# Patient Record
Sex: Female | Born: 1950 | Race: White | Hispanic: No | Smoking: Never smoker
Health system: Southern US, Community
[De-identification: ages and names within clinical notes are randomized; demographics above are authoritative.]

## PROBLEM LIST (undated history)

## (undated) DIAGNOSIS — N814 Uterovaginal prolapse, unspecified: Secondary | ICD-10-CM

## (undated) DIAGNOSIS — IMO0002 Reserved for concepts with insufficient information to code with codable children: Secondary | ICD-10-CM

## (undated) DIAGNOSIS — R32 Unspecified urinary incontinence: Secondary | ICD-10-CM

## (undated) DIAGNOSIS — M1990 Unspecified osteoarthritis, unspecified site: Secondary | ICD-10-CM

## (undated) DIAGNOSIS — Z960 Presence of urogenital implants: Secondary | ICD-10-CM

## (undated) HISTORY — DX: Reserved for concepts with insufficient information to code with codable children: IMO0002

## (undated) HISTORY — DX: Unspecified osteoarthritis, unspecified site: M19.90

## (undated) HISTORY — DX: Presence of urogenital implants: Z96.0

## (undated) HISTORY — DX: Uterovaginal prolapse, unspecified: N81.4

## (undated) HISTORY — DX: Unspecified urinary incontinence: R32

---

## 1998-02-26 ENCOUNTER — Emergency Department (HOSPITAL_COMMUNITY): Admission: EM | Admit: 1998-02-26 | Discharge: 1998-02-26 | Payer: Self-pay | Admitting: Emergency Medicine

## 1999-02-06 ENCOUNTER — Encounter: Admission: RE | Admit: 1999-02-06 | Discharge: 1999-02-06 | Payer: Self-pay | Admitting: Family Medicine

## 1999-02-06 ENCOUNTER — Encounter: Payer: Self-pay | Admitting: Family Medicine

## 1999-12-23 ENCOUNTER — Other Ambulatory Visit: Admission: RE | Admit: 1999-12-23 | Discharge: 1999-12-23 | Payer: Self-pay | Admitting: Family Medicine

## 2000-07-15 ENCOUNTER — Encounter: Admission: RE | Admit: 2000-07-15 | Discharge: 2000-07-15 | Payer: Self-pay | Admitting: Family Medicine

## 2000-07-15 ENCOUNTER — Encounter: Payer: Self-pay | Admitting: Family Medicine

## 2001-10-20 ENCOUNTER — Encounter: Admission: RE | Admit: 2001-10-20 | Discharge: 2001-10-20 | Payer: Self-pay | Admitting: Family Medicine

## 2001-10-20 ENCOUNTER — Encounter: Payer: Self-pay | Admitting: Family Medicine

## 2003-03-01 ENCOUNTER — Encounter: Admission: RE | Admit: 2003-03-01 | Discharge: 2003-03-01 | Payer: Self-pay | Admitting: Family Medicine

## 2003-03-14 ENCOUNTER — Ambulatory Visit (HOSPITAL_COMMUNITY): Admission: RE | Admit: 2003-03-14 | Discharge: 2003-03-14 | Payer: Self-pay | Admitting: Neurosurgery

## 2003-04-05 ENCOUNTER — Ambulatory Visit (HOSPITAL_COMMUNITY): Admission: RE | Admit: 2003-04-05 | Discharge: 2003-04-05 | Payer: Self-pay | Admitting: Family Medicine

## 2003-04-15 HISTORY — PX: BACK SURGERY: SHX140

## 2004-05-16 ENCOUNTER — Ambulatory Visit: Payer: Self-pay | Admitting: Internal Medicine

## 2004-10-30 ENCOUNTER — Ambulatory Visit: Payer: Self-pay | Admitting: Internal Medicine

## 2004-11-26 ENCOUNTER — Ambulatory Visit: Payer: Self-pay | Admitting: Internal Medicine

## 2004-11-27 ENCOUNTER — Ambulatory Visit (HOSPITAL_COMMUNITY): Admission: RE | Admit: 2004-11-27 | Discharge: 2004-11-27 | Payer: Self-pay | Admitting: Family Medicine

## 2005-04-24 ENCOUNTER — Ambulatory Visit: Payer: Self-pay | Admitting: Internal Medicine

## 2005-11-20 ENCOUNTER — Other Ambulatory Visit: Admission: RE | Admit: 2005-11-20 | Discharge: 2005-11-20 | Payer: Self-pay | Admitting: Obstetrics and Gynecology

## 2005-12-01 ENCOUNTER — Ambulatory Visit (HOSPITAL_COMMUNITY): Admission: RE | Admit: 2005-12-01 | Discharge: 2005-12-01 | Payer: Self-pay | Admitting: Family Medicine

## 2005-12-22 ENCOUNTER — Encounter: Admission: RE | Admit: 2005-12-22 | Discharge: 2005-12-22 | Payer: Self-pay | Admitting: Family Medicine

## 2006-12-24 ENCOUNTER — Other Ambulatory Visit: Admission: RE | Admit: 2006-12-24 | Discharge: 2006-12-24 | Payer: Self-pay | Admitting: Obstetrics and Gynecology

## 2007-07-27 ENCOUNTER — Ambulatory Visit (HOSPITAL_COMMUNITY): Admission: RE | Admit: 2007-07-27 | Discharge: 2007-07-27 | Payer: Self-pay | Admitting: Obstetrics and Gynecology

## 2007-08-06 ENCOUNTER — Encounter: Admission: RE | Admit: 2007-08-06 | Discharge: 2007-08-06 | Payer: Self-pay | Admitting: Obstetrics and Gynecology

## 2008-07-26 ENCOUNTER — Encounter: Payer: Self-pay | Admitting: Obstetrics and Gynecology

## 2008-07-26 ENCOUNTER — Ambulatory Visit: Payer: Self-pay | Admitting: Obstetrics and Gynecology

## 2008-07-26 ENCOUNTER — Other Ambulatory Visit: Admission: RE | Admit: 2008-07-26 | Discharge: 2008-07-26 | Payer: Self-pay | Admitting: Obstetrics and Gynecology

## 2008-11-28 ENCOUNTER — Ambulatory Visit: Payer: Self-pay | Admitting: Obstetrics and Gynecology

## 2009-04-14 HISTORY — PX: KNEE ARTHROSCOPY: SHX127

## 2009-08-23 ENCOUNTER — Ambulatory Visit: Payer: Self-pay | Admitting: Obstetrics and Gynecology

## 2009-08-23 ENCOUNTER — Other Ambulatory Visit: Admission: RE | Admit: 2009-08-23 | Discharge: 2009-08-23 | Payer: Self-pay | Admitting: Obstetrics and Gynecology

## 2010-01-14 ENCOUNTER — Telehealth (INDEPENDENT_AMBULATORY_CARE_PROVIDER_SITE_OTHER): Payer: Self-pay | Admitting: *Deleted

## 2010-01-16 ENCOUNTER — Ambulatory Visit: Payer: Self-pay | Admitting: Internal Medicine

## 2010-01-16 DIAGNOSIS — T7840XA Allergy, unspecified, initial encounter: Secondary | ICD-10-CM | POA: Insufficient documentation

## 2010-01-16 DIAGNOSIS — J45998 Other asthma: Secondary | ICD-10-CM | POA: Insufficient documentation

## 2010-01-16 DIAGNOSIS — E7800 Pure hypercholesterolemia, unspecified: Secondary | ICD-10-CM

## 2010-02-26 ENCOUNTER — Ambulatory Visit: Payer: Self-pay | Admitting: Internal Medicine

## 2010-05-04 ENCOUNTER — Encounter: Payer: Self-pay | Admitting: Family Medicine

## 2010-05-05 ENCOUNTER — Encounter: Payer: Self-pay | Admitting: Family Medicine

## 2010-05-14 NOTE — Assessment & Plan Note (Signed)
Summary: 1 months/ mbw   Primary Provider/Referring Provider:  Dr Dianne Dun  CC:  1 month follow up visit-asthma-Increased cough instead of wheezing-used Rescue inahler and cough stopped.Marland Kitchen  History of Present Illness:  History of Present Illness: January 16, 2010- 59 yo never smoker with allergic rhinitis and mild intermittent asthma since childhood, originally followed from 1999 at [facility] where spirometry was normal and she was on allergy vaccine. Last here in 2006, she had done very well and had dropped off her allergy shots when she went to [place]. 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  Triggers for asthma- mold, viral colds, seasonal pollen, especially Fall/ ragweed. Asthma has flared in the last week, with wheeze,  sore throat/ ? viral cold. Needs meds- has none.  Cough is productive thick mucus- hasn't looked at it. No sinus congestion, sneeze, earpressure or headache.   Pneumovax Oct, 1999  February 26, 2010- Asthma, Allergic rhinitis Nurse-CC: 1 month follow up visit-asthma-Increased cough instead of wheezing-used Rescue inahler and cough stopped. Feels much better than last visit. Leaky roof to be repaired soon at school. Now 6 weeks after depo shot she remains stable. Rarely has needed the ventolin rescue inhaler we gave her- maybe six times since last here. Rescue inhaler does help cough- we discussed cough equivalent asthma. Had flu shot.    Asthma History    Asthma Control Assessment:    Age range: 12+ years    Symptoms: 0-2 days/week    Nighttime Awakenings: 0-2/month    Interferes w/ normal activity: no limitations    SABA use (not for EIB): 0-2 days/week    Asthma Control Assessment: Well Controlled   Preventive Screening-Counseling & Management  Alcohol-Tobacco     Smoking Status: never  Current Medications  (verified): 1)  Citalopram Hydrobromide 10 Mg Tabs (Citalopram Hydrobromide) .... Take 1 Tablet By Mouth Once A Day 2)  One-A-Day Extras Antioxidant  Caps (Multiple Vitamins-Minerals) .... Take 1 Capsule By Mouth Once A Day 3)  Fish Oil 1000 Mg Caps (Omega-3 Fatty Acids) .... 2 Caps By Mouth At Bedtime 4)  Coq-10 100 Mg Caps (Coenzyme Q10) .... Take 1 Capsule By Mouth Once A Day 5)  Aspirin 81 Mg Tbec (Aspirin) .... Take 1 Tablet By Mouth Once A Day 6)  Proair Hfa 108 (90 Base) Mcg/act Aers (Albuterol Sulfate) .... 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    Vital Signs:  Patient profile:   60 year old  female Height:      65.5 inches Weight:      178.13 pounds BMI:     29.30 O2 Sat:      95 % on Room air Pulse rate:   75 / minute BP sitting:   116 / 70  (left arm) Cuff size:   regular  Vitals Entered By: Reynaldo Minium CMA (February 26, 2010 3:49 PM)  O2 Flow:  Room  air CC: 1 month follow up visit-asthma-Increased cough instead of wheezing-used Rescue inahler and cough stopped.   Physical Exam  Additional Exam:  General: A/Ox3; pleasant and cooperative, NAD, SKIN: no rash, lesions NODES: no lymphadenopathy HEENT: [place]/AT, EOM- WNL, Conjuctivae- clear, PERRLA, periorbital edema, TM-WNL, Nose- deviated septum-narrow on righ with prominent turbinatest, no polyps, Throat- clear and wnl, Mallampati  III,  NECK: Supple w/ fair ROM, JVD- none, normal carotid impulses w/o bruits Thyroid- normal to palpation CHEST: Clear to P&A with some upper airway pseudowheeze that I pointed out to her.  HEART: RRR, no m/g/r heard ABDOMEN:  KVQ:QVZD, nl pulses, no edema  NEURO: Grossly intact to observation      Impression & Recommendations:  Problem # 1:  ASTHMA (ICD-493.90) She is much happier today. There is considerable stress with her job which feeds into her concerns about the perceived moldy/ old school building.  We will leave her for now with just the rescue inhaler, but we can add maintenance meds as needed Had flu shot  Problem # 2:  ALLERGY (ICD-995.3)  Prominent turbinates but she isn't noting nasal congestion or drainage. We won't add meds.   Other Orders: Est. Patient Level III (63875)  Patient Instructions: 1)  Please schedule a follow-up appointment in 3 months. Call earlier as needed.

## 2010-05-14 NOTE — Progress Notes (Signed)
Summary: req ALC asap  Phone Note Call from Patient   Caller: Patient Call For: young Summary of Call: pt saw dr Zonia Kief at [facility]. (saw dr young at times as well) . wants to resume allergy shots (she's been off for yrs now). works in a [employer] engineer. wants a consult ASAP- can't wait until 1st avail in nov. cough/ wheezing. cell [phone] Initial call taken by: Tivis Ringer, CNA,  January 14, 2010 10:24 AM  Follow-up for Phone Call        Pls advise if we can get this pt worked in asap for [facility] with Dr Maple Hudson, thanks! I requested this chart in case needed Follow-up by: Vernie Murders,  January 14, 2010 10:44 AM  Additional Follow-up for Phone Call Additional follow up Details #1::        Please let pt know we can see them this Wednesday at 2pm. Thanks.Reynaldo Minium CMA  January 14, 2010 1:50 PM   Called, spoke with pt.  She is ok with coming in on Oct. 5 at 2pm to see CY.  Appt scheduled.  Pt informed to arrive 15 minutes early and bring current insurance card and medications to visit.  She verbalized understanding Additional Follow-up by: Gweneth Dimitri RN,  January 14, 2010 2:01 PM

## 2010-05-14 NOTE — Assessment & Plan Note (Signed)
Summary: allergy consult - former [facility] pt / cj   Vital Signs:  Patient profile:   60 year old female Height:      65.5 inches Weight:      178.13 pounds BMI:     29.30 O2 Sat:      98 % on Room air Pulse rate:   65 / minute BP sitting:   124 / 86  (left arm) Cuff size:   regular  Vitals Entered By: Boone Master CNA/MA (January 16, 2010 1:57 PM)  O2 Flow:  Room air CC: asthma flare d/t new work environment with mold, trash and cigarette exposure - former pt of Dr. Andria Meuse Comments Medications reviewed with patient Daytime contact number verified with patient. Boone Master CNA/MA  January 16, 2010 1:59 PM    Primary Cullin Dishman/Referring Yaseen Gilberg:  Dr Dianne Dun  CC:  asthma flare d/t new work environment with mold and trash and cigarette exposure - former pt of Dr. Andria Meuse.  History of Present Illness: January 16, 2010- 59 yo never smoker with allergic rhinitis and mild intermittent asthma since childhood, originally followed from 1999 at [facility] where spirometry was normal and she was on allergy vaccine. Last here in 2006, she had done very well and had dropped off her allergy shots when she went to [place]. 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  Triggers for asthma- mold, viral colds, seasonal pollen, especially Fall/ ragweed. Asthma has flared in the last week, with wheeze,  sore throat/ ? viral cold. Needs meds- has none.  Cough is productive thick mucus- hasn't looked at it. No sinus congestion, sneeze, earpressure or headache.   Pneumovax Oct, 1999  Asthma History    Initial Asthma Severity Rating:    Age range: 12+ years    Symptoms: daily    Nighttime Awakenings: 0-2/month    Interferes w/ normal activity: minor limitations    SABA use (not for EIB): 0-2 days/week    Asthma Severity Assessment: Moderate  Persistent   Preventive Screening-Counseling & Management  Alcohol-Tobacco     Smoking Status: never  Current Medications (verified): 1)  Citalopram Hydrobromide 10 Mg Tabs (Citalopram Hydrobromide) .... Take 1 Tablet By Mouth Once A Day 2)  One-A-Day Extras Antioxidant  Caps (Multiple Vitamins-Minerals) .... Take 1 Capsule By Mouth Once A Day 3)  Fish Oil 1000 Mg Caps (Omega-3 Fatty Acids) .... 2 Caps By Mouth At Bedtime 4)  Coq-10 100 Mg Caps (Coenzyme Q10) .... Take 1 Capsule By Mouth Once A Day 5)  Aspirin 81 Mg Tbec (Aspirin) .... 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  Risk Factors: Smoking Status: never (01/16/2010)  Past Medical History:  ALLERGY (ICD-995.3) HYPERCHOLESTEROLEMIA (ICD-272.0) ASTHMA (ICD-493.90)    Past Surgical History: lumbar spine surgery 2004 right knee surgery Jan 2011  Family History: allergies - mother, sister, father asthma - sister, dad cancer - sister (breast)  Social History: never smoked no alcohol divorced 2 grown children teaches High School band lives aloneSmoking Status:  never  Review of Systems  See HPI       The patient complains of shortness of breath with activity, shortness of breath at rest, productive cough, sneezing, and ear ache.  The patient denies non-productive cough, coughing up blood, chest pain, irregular heartbeats, acid heartburn, indigestion, loss of appetite, weight change, abdominal pain, difficulty swallowing, sore throat, tooth/dental problems, headaches, nasal congestion/difficulty breathing through nose, itching, anxiety, depression, hand/feet swelling, joint stiffness or pain,  rash, change in color of mucus, and fever.    Physical Exam  Additional Exam:  General: A/Ox3; pleasant and cooperative, NAD, SKIN: no rash, lesions NODES: no lymphadenopathy HEENT: [place]/AT, EOM- WNL, Conjuctivae- clear, PERRLA, TM-WNL, Nose- deviated septum-narrow on right, no polyps, Throat- clear and wnl, Mallampati  III,  NECK: Supple w/ fair ROM, JVD- none, normal carotid impulses w/o bruits Thyroid- normal to palpation CHEST: Clear to P&A. Raspy forced expiratory noise- "bronchitic" HEART: RRR, no m/g/r heard ABDOMEN: Soft and nl; nml bowel sounds; no organomegaly or masses noted JYN:WGNF, nl pulses, no edema  NEURO: Grossly intact to observation      Impression & Recommendations:  Problem # 1:  ASTHMA (ICD-493.90) She has a long hx of asthma from childhood, but had done very well over the years after allergy vaccine. The present episode began abruptly a week ago and may be mostly viral. She is concerned about sustained exposure to the moldy, run down school where she now works.   We will try a mixed antiinflammatory strategy frist and see if we can turn this process off. We can reevaluate for long term/ maintenance  approaches if needed. We can allso give her flu vax.   Medications Added to Medication List This Visit: 1)  Citalopram Hydrobromide 10 Mg Tabs (Citalopram hydrobromide) .... Take 1 tablet by mouth once a day 2)  One-a-day Extras Antioxidant Caps (Multiple vitamins-minerals) .... Take 1 capsule by mouth once a day 3)  Fish Oil 1000 Mg Caps (Omega-3 fatty acids) .... 2 caps by mouth at bedtime 4)  Coq-10 100 Mg Caps (Coenzyme q10) .... Take 1 capsule by mouth once a day 5)  Aspirin 81 Mg Tbec (Aspirin) .... Take 1 tablet by mouth once a day 6)  Proair Hfa 108 (90 Base) Mcg/act Aers (Albuterol sulfate) .... 2 puffs four times a day as needed 7)  Singulair 10 Mg Tabs (Montelukast sodium) .Marland Kitchen.. 1 daily  Other Orders: Nebulizer Tx (62130) Admin of Therapeutic Inj   intramuscular or subcutaneous (86578) Depo- Medrol 80mg  (J1040) Admin 1st Vaccine (46962) Flu Vaccine 93yrs + (95284) New Patient Level IV (13244)  Patient Instructions: 1)  Please schedule a follow-up appointment in 1 month. 2)  neb xop 1.25 3)  depo 80 4)  Samples Singulair 10 mg, 1 daily 5)   Script Proair/ albuterol rescue inhaler  sample if available 6)    2 puffs four times a day as needed  7)  Flu vaccine Prescriptions: SINGULAIR 10 MG TABS (MONTELUKAST SODIUM) 1 daily  #7 x 0   Entered and Authorized by:   Waymon Budge MD   Signed by:   Waymon Budge MD on 01/16/2010   Method used:   Samples Given   RxID:   [id number] PROAIR HFA 108 (90 BASE) MCG/ACT AERS (ALBUTEROL SULFATE) 2 puffs four times a day as needed  #1 x prn   Entered and Authorized by:   Waymon Budge MD   Signed by:   Waymon Budge MD on 01/16/2010   Method used:  Print then Give to Patient   RxID:   [id number]    Immunization History:  Influenza Immunization History:    Influenza:  historical (01/12/2009)    Medication Administration  Injection # 1:    Medication: Depo- Medrol 80mg     Diagnosis: ASTHMA (ICD-493.90)    Route: IM    Site: LUOQ gluteus    Exp Date: 07-2012    Lot #: [id number]    Mfr: Pharmacia    Patient tolerated injection without complications    Given by: Boone Master CNA/MA (January 16, 2010 3:24 PM)  Orders Added: 1)  Nebulizer Tx [phone] 2)  Admin of Therapeutic Inj  intramuscular or subcutaneous [96372] 3)  Depo- Medrol 80mg  [J1040] 4)  Admin 1st Vaccine [90471] 5)  Flu Vaccine 82yrs + [24401] 6)  New Patient Level IV [02725]        Flu Vaccine Consent Questions     Do you have a history of severe allergic reactions to this vaccine? no    Any prior history of allergic reactions to egg and/or gelatin? no    Do you have a sensitivity to the preservative Thimersol? no    Do you have a past history of Guillan-Barre Syndrome? no    Do you  currently have an acute febrile illness? no    Have you ever had a severe reaction to latex? no    Vaccine information given and explained to patient? yes    Are you currently pregnant? no    Lot Number:[id number]   Exp Date:10/12/2010   Site Given  Left Deltoid IM.lbflu  Boone Master CNA/MA  January 16, 2010 3:24 PM

## 2010-05-29 ENCOUNTER — Ambulatory Visit (INDEPENDENT_AMBULATORY_CARE_PROVIDER_SITE_OTHER): Payer: BC Managed Care – PPO | Admitting: Internal Medicine

## 2010-05-29 ENCOUNTER — Encounter: Payer: Self-pay | Admitting: Internal Medicine

## 2010-05-29 DIAGNOSIS — J45909 Unspecified asthma, uncomplicated: Secondary | ICD-10-CM

## 2010-05-29 DIAGNOSIS — T7840XA Allergy, unspecified, initial encounter: Secondary | ICD-10-CM

## 2010-05-30 DIAGNOSIS — Z23 Encounter for immunization: Secondary | ICD-10-CM

## 2010-06-05 NOTE — Assessment & Plan Note (Signed)
Summary: rov 3 months/kp   Primary Provider/Referring Provider:  Dr Dianne Dun  CC:  3 month follow up visit-asthma and allergies; tx is making things better at this time.Kathryn Davis  History of Present Illness: History of Present Illness: January 16, 2010- 60 yo never smoker with allergic rhinitis and mild intermittent asthma since childhood, originally followed from 1999 at [facility] where spirometry was normal and she was on allergy vaccine. Last here in 2006, she had done very well and had dropped off her allergy shots when she went to [place]. 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  Triggers for asthma- mold, viral colds, seasonal pollen, especially Fall/ ragweed. Asthma has flared in the last week, with wheeze,  sore throat/ ? viral cold. Needs meds- has none.  Cough is productive thick mucus- hasn't looked at it. No sinus congestion, sneeze, earpressure or headache.   Pneumovax Oct, 1999  February 26, 2010- Asthma, Allergic rhinitis Nurse-CC: 1 month follow up visit-asthma-Increased cough instead of wheezing-used Rescue inahler and cough stopped. Feels much better than last visit. Leaky roof to be repaired soon at school. Now 6 weeks after depo shot she remains stable. Rarely has needed the ventolin rescue inhaler we gave her- maybe six times since last here. Rescue inhaler does help cough- we discussed cough equivalent asthma. Had flu shot.   May 29, 2010- Asthma, Allergic rhinitis Nurse-CC: 3 month follow up visit-asthma and allergies; tx is making things better at this time. Continues working at old school building with dust and mold exposure. They did fix the heat, but not mold or water leaks. Meds help. No colds this winter. We did discuss issue of suppression of symptoms while she continues offending exposure. Hasn't needed Proair since  before Christmas, w/ no wheeze or cough. Sip of water occasionally suppresses cough which would otherwise be a hard paroxysm. Denies postnasal drip or heartburn. Waking with left frontal headaches blamed on indoor heat.     Asthma History    Asthma Control Assessment:    Age range: 12+ years    Symptoms: 0-2 days/week    Nighttime Awakenings: 0-2/month    Interferes w/ normal activity: no limitations    SABA use (not for EIB): 0-2 days/week    Asthma Control Assessment: Well Controlled   Preventive Screening-Counseling & Management  Alcohol-Tobacco     Smoking Status: never  Current Medications (verified): 1)  Citalopram Hydrobromide 10 Mg Tabs (Citalopram Hydrobromide) .... Take 1 Tablet By Mouth Once A Day 2)  One-A-Day Extras Antioxidant  Caps (Multiple Vitamins-Minerals) .... Take 1 Capsule By Mouth Once A Day 3)  Fish Oil 1000 Mg Caps (Omega-3 Fatty Acids) .... 2 Caps By Mouth At Bedtime 4)  Aspirin 81 Mg Tbec (Aspirin) .... Take 1 Tablet By Mouth Once A Day 5)  Proair Hfa 108 (90 Base) Mcg/act Aers (Albuterol Sulfate) .... 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  The patient denies shortness of breath with activity, shortness of breath at rest, productive cough, coughing up blood, chest  pain, irregular heartbeats, acid heartburn, indigestion, loss of appetite, weight change, abdominal pain, difficulty swallowing, sore throat, tooth/dental problems, headaches, nasal congestion/difficulty breathing through nose, and sneezing.    Vital Signs:  Patient profile:   60 year old female Height:      65.5 inches Weight:      184 pounds BMI:     30.26 O2 Sat:      96 % on Room air Pulse rate:   73 / minute BP sitting:   130 / 76  (left arm) Cuff size:   regular  Vitals Entered By: Reynaldo Minium CMA (May 29, 2010 8:59 AM)  O2 Flow:  Room air CC: 3 month follow up visit-asthma and allergies; tx is making things better at this time.   Physical Exam  Additional Exam:  General: A/Ox3; pleasant and cooperative, NAD, SKIN: no rash, lesions NODES: no lymphadenopathy HEENT: [place]/AT, EOM- WNL, Conjuctivae- clear, PERRLA, periorbital edema, TM-WNL, Nose- deviated septum-narrow on righ with prominent turbinates, no polyps, Throat- clear and wnl, Mallampati  III,  NECK: Supple w/ fair ROM, JVD- none, normal carotid impulses w/o bruits Thyroid- normal to palpation CHEST: Trace expiratory wheeze right infrascapular, heard once HEART: RRR, no m/g/r heard ABDOMEN: average build ZOX:WRUE, nl pulses, no edema  NEURO: Grossly intact to observation      Impression & Recommendations:  Problem # 1:  ALLERGY (ICD-995.3)  Discussed anticipatory or maintenance use of an otc antihistamine as needed this Spring and discussed choices.  Discussed saline spray and rinse.  Will give booster pneumovax, first was 10 years ago.  Saline nasa lspray or Neti pot options may help morning headache.   Problem # 2:  ASTHMA (ICD-493.90) Adequate control now. We discussed criteria for addition of a maintenance inhaler if needed.  Came off Singulair- not needed. Came allergy vaccine when she went  [place] se veral years ago and so far ok.  Other Orders: Est. Patient Level III (45409)  Patient  Instructions: 1)  Please schedule a follow-up appointment in 6 months. Please call sooner if needed. 2)  saline nasal spray, or more definitive rinse with a Neti pot can really help dry stuffy noses, early sinus infections etc 3)  Pneumovax    Appended Document: Orders Update-PNA charges    Clinical Lists Changes  Orders: Added new Service order of Pneumococcal Vaccine (81191) - Signed Added new Service order of Admin 1st Vaccine (47829) - Signed Observations: Added new observation of PNEUMOVAXVIS: 03/19/09 version given May 30, 2010. (05/30/2010 9:41) Added new observation of PNEUMOVAXLOT: 1295Z (05/30/2010 9:41) Added new observation of PNEUMOVAXEXP: 08/02/2010 (05/30/2010 9:41) Added new observation of PNEUMOVAXBY: Reynaldo Minium CMA (05/30/2010 9:41) Added new observation of PNEUMOVAXRTE: IM (05/30/2010 9:41) Added new observation of PNEUMOVAXDOS: 0.5 ml (05/30/2010 9:41) Added new observation of PNEUMOVAXMFR: Merck (05/30/2010 9:41) Added new observation of PNEUMOVAXSIT: left deltoid (05/30/2010 9:41) Added new observation of PNEUMOVAX: Pneumovax (05/30/2010 9:41)       Immunizations Administered:  Pneumonia Vaccine:    Vaccine Type: Pneumovax    Site: left deltoid    Mfr: Merck    Dose: 0.5 ml    Route: IM    Given  by: Reynaldo Minium CMA    Exp. Date: 08/02/2010    Lot #: [id number]    VIS given: 03/19/09 version given May 30, 2010.

## 2010-07-25 ENCOUNTER — Telehealth: Payer: Self-pay | Admitting: Internal Medicine

## 2010-07-25 NOTE — Telephone Encounter (Signed)
She has Proair to use up to 2 puffs, 4 x daily for wheezing. If that isn't enough, we may need to give some prednisone. It doesn't sound like a bacterial infection that an antibiotic would help. She can try Mucinex and extra fluids. Let us know tomorrow early if she still isn't doing well.

## 2010-07-25 NOTE — Telephone Encounter (Signed)
Spoke w/ pt and she c/o dry cough, wheezing, some nasal congestion, when sneezes only has clear mucus come out, some increased sob. Pt states this all started over the weekend. Pt is using her xopenex inhaler every 4 hrs. Pt has an apt 11/21/10. Please advise recs for pt Dr. Maple Hudson. Thanks  NKDA  Carver Fila, CMA

## 2010-07-26 NOTE — Telephone Encounter (Signed)
LMTCB

## 2010-07-29 ENCOUNTER — Telehealth: Payer: Self-pay | Admitting: Internal Medicine

## 2010-07-29 NOTE — Telephone Encounter (Signed)
lmomtcb x 2  

## 2010-07-29 NOTE — Telephone Encounter (Signed)
lmomtcb x 1. See previous msg from 07/25/2010.

## 2010-07-31 NOTE — Telephone Encounter (Signed)
We have tried to reach the pt several times and have left msgs for her to return triage's call. Left a detailed msg on VM asking that she call our office to leave a msg for the nurse if she is still having problems. Will sign off on this msg.

## 2010-08-30 NOTE — Op Note (Signed)
NAME:  Kathryn Davis, Kathryn Davis                  ACCOUNT NO.:  [id number]   MEDICAL RECORD NO.:  [id number]                   PATIENT TYPE:  OIB   LOCATION:  2855                                 FACILITY:  [facility]   PHYSICIAN:  Reinaldo Meeker, M.D.              DATE OF BIRTH:  Jul 22, 1950   DATE OF PROCEDURE:  03/14/2003  DATE OF DISCHARGE:                                 OPERATIVE REPORT   PREOPERATIVE DIAGNOSIS:  Herniated disk, L5-S1, right.   POSTOPERATIVE DIAGNOSIS:  Herniated disk, L5-S1, right.   OPERATION PERFORMED:  Right L5-S1 interlaminar laminotomy for excision of  herniated disk with operating microscope.   SECONDARY PROCEDURE:  Microdissection of L5-S1 disk and S1 nerve root.   SURGEON:  Reinaldo Meeker, M.D.   ASSISTANT:  Kathaleen Maser. Pool, M.D.   ANESTHESIA:  General.   DESCRIPTION OF PROCEDURE:  After being placed in the prone position, the  patient's back was prepped and draped in the usual sterile fashion.  Localizing x-ray was taken prior to incision to identify the appropriate  level.  Midline incision was made above the spinous processes of L5 and S1.  Using Bovie cutting current, the incision was carried to the spinous  processes.  Subperiosteal dissection was then carried out on the right side  of the spinous processes and lamina and the McCullough self-retaining  retractor was placed for exposure.  Second x-ray showed approach to the  appropriate  level.  Using the high speed drill, the inferior one third of  the L5 lamina and medial one third of the facet joint were removed.  The  drill was then used to remove the superior one third of the S1 lamina.  Residual bone and ligamentum flavum were removed in a piecemeal fashion.  The microscope was draped, brought into the field and used for the remainder  of the case.  Using microdissection technique, the lateral aspect of the  thecal sac and S1 nerve were identified.  Cutting coagulation was carried  down to  the floor of the canal to identify the L5-S1 disk which was found to  be focally herniated beneath the nerve root.  After coagulating out the  annulus, the annulus was incised with a 15 blade.  Using pituitary rongeurs  and curets, a thorough disk space cleanout was carried out.  At the same  time great care was taken to avoid injury to the neural element which was  successfully done.  At this point inspection was carried out in all  directions for any evidence of residual compression and none could be  identified.  Large amounts of irrigation were carried out and any bleeding  controlled with bipolar coagulation and Gelfoam.  The wound was then closed  using interrupted Vicryl in the muscle and fascia, subcutaneous and  subcuticular tissues and staples on the skin.  A sterile dressing was then  applied and the patient was extubated and taken to the recovery room  in  stable condition.                                               Reinaldo Meeker, M.D.    ROK/MEDQ  D:  03/14/2003  T:  03/14/2003  Job:  [id number]

## 2010-09-24 ENCOUNTER — Other Ambulatory Visit (HOSPITAL_COMMUNITY)
Admission: RE | Admit: 2010-09-24 | Discharge: 2010-09-24 | Disposition: A | Discharge status: Home / Self Care | Payer: BC Managed Care – PPO | Source: Ambulatory Visit | Attending: Obstetrics and Gynecology | Admitting: Obstetrics and Gynecology

## 2010-09-24 ENCOUNTER — Encounter (INDEPENDENT_AMBULATORY_CARE_PROVIDER_SITE_OTHER): Payer: BC Managed Care – PPO | Admitting: Obstetrics and Gynecology

## 2010-09-24 ENCOUNTER — Other Ambulatory Visit: Payer: Self-pay | Admitting: Obstetrics and Gynecology

## 2010-09-24 DIAGNOSIS — Z124 Encounter for screening for malignant neoplasm of cervix: Secondary | ICD-10-CM | POA: Insufficient documentation

## 2010-09-24 DIAGNOSIS — R82998 Other abnormal findings in urine: Secondary | ICD-10-CM

## 2010-09-24 DIAGNOSIS — Z01419 Encounter for gynecological examination (general) (routine) without abnormal findings: Secondary | ICD-10-CM

## 2010-09-24 DIAGNOSIS — Z833 Family history of diabetes mellitus: Secondary | ICD-10-CM

## 2010-10-22 ENCOUNTER — Other Ambulatory Visit: Payer: Self-pay | Admitting: Obstetrics and Gynecology

## 2010-10-22 DIAGNOSIS — Z1231 Encounter for screening mammogram for malignant neoplasm of breast: Secondary | ICD-10-CM

## 2010-10-29 ENCOUNTER — Ambulatory Visit
Admission: RE | Admit: 2010-10-29 | Discharge: 2010-10-29 | Disposition: A | Discharge status: Home / Self Care | Payer: BC Managed Care – PPO | Source: Ambulatory Visit | Attending: Obstetrics and Gynecology | Admitting: Obstetrics and Gynecology

## 2010-10-29 DIAGNOSIS — Z1231 Encounter for screening mammogram for malignant neoplasm of breast: Secondary | ICD-10-CM

## 2010-10-30 ENCOUNTER — Other Ambulatory Visit: Payer: Self-pay | Admitting: Obstetrics and Gynecology

## 2010-10-30 DIAGNOSIS — R928 Other abnormal and inconclusive findings on diagnostic imaging of breast: Secondary | ICD-10-CM

## 2010-11-05 ENCOUNTER — Ambulatory Visit
Admission: RE | Admit: 2010-11-05 | Discharge: 2010-11-05 | Disposition: A | Discharge status: Home / Self Care | Payer: BC Managed Care – PPO | Source: Ambulatory Visit | Attending: Obstetrics and Gynecology | Admitting: Obstetrics and Gynecology

## 2010-11-05 DIAGNOSIS — R928 Other abnormal and inconclusive findings on diagnostic imaging of breast: Secondary | ICD-10-CM

## 2010-11-08 ENCOUNTER — Encounter: Payer: Self-pay | Admitting: Internal Medicine

## 2010-11-13 ENCOUNTER — Encounter: Payer: Self-pay | Admitting: Internal Medicine

## 2010-11-13 ENCOUNTER — Ambulatory Visit (INDEPENDENT_AMBULATORY_CARE_PROVIDER_SITE_OTHER): Payer: BC Managed Care – PPO | Admitting: Internal Medicine

## 2010-11-13 VITALS — BP 118/70 | HR 72 | Ht 65.5 in | Wt 193.0 lb

## 2010-11-13 DIAGNOSIS — J45909 Unspecified asthma, uncomplicated: Secondary | ICD-10-CM

## 2010-11-13 MED ORDER — ALBUTEROL SULFATE HFA 108 (90 BASE) MCG/ACT IN AERS: 2.0000 | INHALATION_SPRAY | RESPIRATORY_TRACT | Status: DC | PRN

## 2010-11-13 NOTE — Progress Notes (Signed)
Subjective:    Patient ID: Kathryn Davis, female    DOB: [birth year], 60 y.o.   MRN: [id number]  HPI 11/13/10- 71 yoF never smoker followed for asthma, allergic rhinitis Last here May 29, 2010 School building getting a new roof, to fix water leaks. 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 She is optimistic that repair of water leaks will improve the mold and environmental hazard concerns that she has had about the building. Needed rescue inhaler for awhile this spring x 2 weeks with wheeze then. Needs refill. Discussed role of rescue meds.  Review of Systems Constitutional:   No-   weight loss, night sweats, fevers, chills, fatigue, lassitude. HEENT:   No-   headaches, difficulty swallowing, tooth/dental problems, sore throat,                  No-   sneezing, itching, ear ache, nasal congestion, post nasal drip,   CV:  No-   chest pain, orthopnea, PND, swelling in lower extremities, anasarca, dizziness, palpitations  GI:  No-   heartburn, indigestion, abdominal pain, nausea, vomiting, diarrhea,                 change in bowel habits, loss of appetite  Resp: No-   shortness of breath with exertion or at rest.  No-  excess mucus,             No-   productive cough,  No non-productive cough,  No-  coughing up of blood.              No-   change in color of mucus.  No- wheezing.    Skin: No-   rash or lesions.  GU: No-   dysuria, change in color of urine, no urgency or frequency.  No- flank pain.  MS:  No-   joint pain or swelling.  No- decreased range of motion.  No- back pain.  Psych:  No- change in mood or affect. No depression or anxiety.  No memory loss.      Objective:   Physical Exam General- Alert, Oriented, Affect-appropriate, Distress- none acute Skin- rash-none, lesions- none, excoriation- none Lymphadenopathy- none Head- atraumatic            Eyes- Gross vision intact, PERRLA, conjunctivae clear secretions            Ears- Hearing, canals  normal            Nose- Clear, No-Septal dev, mucus, polyps, erosion, perforation             Throat- Mallampati III , mucosa clear , drainage- none, tonsils- atrophic Neck- flexible , trachea midline, no stridor , thyroid nl, carotid no bruit Chest - symmetrical excursion , unlabored           Heart/CV- RRR , no murmur , no gallop  , no rub, nl s1 s2                           - JVD- none , edema- none, stasis changes- none, varices- none           Lung- clear to P&A, wheeze- none, cough- none , dullness-none, rub- none           Chest wall-  Abd- tender-no, distended-no, bowel sounds-present, HSM- no Br/ Gen/ Rectal- Not done, not indicated Extrem- cyanosis- none, clubbing, none, atrophy- none, strength- nl Neuro- grossly intact to observation  Assessment & Plan:

## 2010-11-13 NOTE — Patient Instructions (Signed)
Script for rescue inhaler  Please call as needed

## 2010-11-13 NOTE — Assessment & Plan Note (Addendum)
Good control. 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 Also discussed use of rescue inhaler this spring. I don't think she needs maintenance med at this time.

## 2010-11-21 ENCOUNTER — Ambulatory Visit: Payer: BC Managed Care – PPO | Admitting: Internal Medicine

## 2011-02-25 ENCOUNTER — Telehealth: Payer: Self-pay | Admitting: Internal Medicine

## 2011-02-25 NOTE — Telephone Encounter (Signed)
[email or web address]

## 2011-02-26 NOTE — Telephone Encounter (Signed)
LMOMTCB x 1 

## 2011-02-27 NOTE — Telephone Encounter (Signed)
lmomtcb  

## 2011-02-28 NOTE — Telephone Encounter (Signed)
After several attempts to reach patient and unable I will sign the note;I did call and left a message for patient to call us if she still needed CY to treat her.

## 2011-09-25 ENCOUNTER — Encounter: Payer: Self-pay | Admitting: Obstetrics and Gynecology

## 2011-09-25 ENCOUNTER — Ambulatory Visit (INDEPENDENT_AMBULATORY_CARE_PROVIDER_SITE_OTHER): Payer: BC Managed Care – PPO | Admitting: Obstetrics and Gynecology

## 2011-09-25 VITALS — BP 120/74 | Ht 64.5 in | Wt 196.0 lb

## 2011-09-25 DIAGNOSIS — N814 Uterovaginal prolapse, unspecified: Secondary | ICD-10-CM

## 2011-09-25 DIAGNOSIS — Z960 Presence of urogenital implants: Secondary | ICD-10-CM | POA: Insufficient documentation

## 2011-09-25 DIAGNOSIS — Z01419 Encounter for gynecological examination (general) (routine) without abnormal findings: Secondary | ICD-10-CM

## 2011-09-25 DIAGNOSIS — Z833 Family history of diabetes mellitus: Secondary | ICD-10-CM

## 2011-09-25 DIAGNOSIS — N8111 Cystocele, midline: Secondary | ICD-10-CM

## 2011-09-25 DIAGNOSIS — E7800 Pure hypercholesterolemia, unspecified: Secondary | ICD-10-CM

## 2011-09-25 DIAGNOSIS — IMO0002 Reserved for concepts with insufficient information to code with codable children: Secondary | ICD-10-CM | POA: Insufficient documentation

## 2011-09-25 MED ORDER — CITALOPRAM HYDROBROMIDE 10 MG PO TABS: 10.0000 mg | ORAL_TABLET | Freq: Every day | ORAL | Status: DC

## 2011-09-25 NOTE — Progress Notes (Signed)
Patient came to see me today for her annual GYN exam. She continues to use a ring pessary with support size 5 which gives her excellent results for her uterine prolapse and cystocele. She is not sexually active. She is not having vaginal bleeding. She is not having pelvic pain. She is up-to-date on mammograms. She is due for a followup bone density. She had a normal bone density in  2003. She has had no fractures. She has never had an abnormal Pap smear. In 2011 her total cholesterol was 258 was triglycerides of 278 and LDL of 155. Her HDL was 47. She needs this rechecked but did not fast  today. Her mother has just been diagnosed with diabetes. She takes Celexa with excellent results for mood swings.  Physical examination: Selena Batten gardner present. HEENT within normal limits. Neck: Thyroid not large. No masses. Supraclavicular nodes: not enlarged. Breasts: Examined in both sitting and lying  position. No skin changes and no masses. Abdomen: Soft no guarding rebound or masses or hernia. Pelvic: External: Within normal limits. BUS: Within normal limits. Vaginal:within normal limits. Good estrogen effect. Almost third degree cystocele. Cervix: clean. Uterus: Normal size and shape with first degree uterine prolapse. Adnexa: No masses. Rectovaginal exam: Confirmatory and negative. Extremities: Within normal limits.  Assessment: Uterine prolapse with cystocele. Elevated lipids.  Plan: Scheduled bone density. Pessary removed, washed, and reinserted. She will return when necessary. Mammogram this Summer. Return fasted for lab work.

## 2011-09-25 NOTE — Patient Instructions (Addendum)
Return fasted for lab work. 

## 2011-09-26 LAB — URINALYSIS W MICROSCOPIC + REFLEX CULTURE
Casts: NONE SEEN
Glucose, UA: NEGATIVE mg/dL
Ketones, ur: NEGATIVE mg/dL
pH: 6 (ref 5.0–8.0)

## 2011-09-27 LAB — URINE CULTURE: Colony Count: 9000

## 2011-10-10 ENCOUNTER — Other Ambulatory Visit: Payer: Self-pay | Admitting: Obstetrics and Gynecology

## 2011-10-21 ENCOUNTER — Other Ambulatory Visit: Payer: Self-pay | Admitting: Obstetrics and Gynecology

## 2011-10-21 DIAGNOSIS — Z1231 Encounter for screening mammogram for malignant neoplasm of breast: Secondary | ICD-10-CM

## 2011-10-31 ENCOUNTER — Ambulatory Visit
Admission: RE | Admit: 2011-10-31 | Discharge: 2011-10-31 | Disposition: A | Discharge status: Home / Self Care | Payer: BC Managed Care – PPO | Source: Ambulatory Visit | Attending: Obstetrics and Gynecology | Admitting: Obstetrics and Gynecology

## 2011-10-31 DIAGNOSIS — Z1231 Encounter for screening mammogram for malignant neoplasm of breast: Secondary | ICD-10-CM

## 2011-11-07 ENCOUNTER — Ambulatory Visit: Payer: BC Managed Care – PPO

## 2011-11-13 ENCOUNTER — Ambulatory Visit (INDEPENDENT_AMBULATORY_CARE_PROVIDER_SITE_OTHER): Payer: BC Managed Care – PPO | Admitting: Internal Medicine

## 2011-11-13 ENCOUNTER — Encounter: Payer: Self-pay | Admitting: Internal Medicine

## 2011-11-13 VITALS — BP 130/80 | HR 73 | Ht 65.5 in | Wt 194.0 lb

## 2011-11-13 DIAGNOSIS — J3089 Other allergic rhinitis: Secondary | ICD-10-CM

## 2011-11-13 DIAGNOSIS — J309 Allergic rhinitis, unspecified: Secondary | ICD-10-CM

## 2011-11-13 DIAGNOSIS — J45998 Other asthma: Secondary | ICD-10-CM

## 2011-11-13 DIAGNOSIS — J45909 Unspecified asthma, uncomplicated: Secondary | ICD-10-CM

## 2011-11-13 MED ORDER — ALBUTEROL SULFATE HFA 108 (90 BASE) MCG/ACT IN AERS: 2.0000 | INHALATION_SPRAY | RESPIRATORY_TRACT | Status: DC | PRN

## 2011-11-13 NOTE — Progress Notes (Signed)
Subjective:    Patient ID: Kathryn Davis, female    DOB: [birth year], 61 y.o.   MRN: [id number]  HPI 11/13/10- 27 yoF never smoker followed for asthma, allergic rhinitis Last here May 29, 2010 School building getting a new roof, to fix water leaks. 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 She is optimistic that repair of water leaks will improve the mold and environmental hazard concerns that she has had about the building. Needed rescue inhaler for awhile this spring x 2 weeks with wheeze then. Needs refill. Discussed role of rescue meds.  11/13/11- 60 yoF never smoker followed for asthma, allergic rhinitis Denies any wheezing, cough, congestion, or SOB at this time; denies any trouble with allergies . 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 There is no more mold problem. She needed her metered inhaler only with the spring cold but has not needed maintenance therapy. She credits her years of allergy vaccine in the past for resolving her allergic rhinitis and had no problem this spring.  ROS-see HPI Constitutional:   No-   weight loss, night sweats, fevers, chills, fatigue, lassitude. HEENT:   No-  headaches, difficulty swallowing, tooth/dental problems, sore throat,       No-  sneezing, itching, ear ache, nasal congestion, post nasal drip,  CV:  No-   chest pain, orthopnea, PND, swelling in lower extremities, anasarca, dizziness, palpitations Resp: No-   shortness of breath with exertion or at rest.              No-   productive cough,  No non-productive cough,  No- coughing up of blood.              No-   change in color of mucus.  No- wheezing.   Skin: No-   rash or lesions. GI:  No-   heartburn, indigestion, abdominal pain, nausea, vomiting,  GU:  MS:  No-   joint pain or swelling.  . Neuro-     nothing unusual Psych:  No- change in mood or affect. No depression or anxiety.  No memory  loss.  Objective:   Physical Exam General- Alert, Oriented, Affect-appropriate, Distress- none acute Skin- rash-none, lesions- none, excoriation- none Lymphadenopathy- none Head- atraumatic            Eyes- Gross vision intact, PERRLA, conjunctivae clear secretions            Ears- Hearing, canals normal            Nose- + turbinate edema, No-Septal dev, mucus, polyps, erosion, perforation             Throat- Mallampati III , mucosa clear , drainage- none, tonsils- atrophic Neck- flexible , trachea midline, no stridor , thyroid nl, carotid no bruit Chest - symmetrical excursion , unlabored           Heart/CV- RRR , no murmur , no gallop  , no rub, nl s1 s2                           - JVD- none , edema- none, stasis changes- none, varices- none           Lung- clear to P&A, wheeze- none, cough- none , dullness-none, rub- none           Chest wall-  Abd-  Br/ Gen/ Rectal- Not done, not indicated Extrem- cyanosis-  none, clubbing, none, atrophy- none, strength- nl Neuro- grossly intact to observation  Assessment & Plan:

## 2011-11-13 NOTE — Patient Instructions (Addendum)
Script sent for refill proair rescue inhaler  Ok to use an otc antihistamine if needed  Please call as needed

## 2011-11-19 DIAGNOSIS — J302 Other seasonal allergic rhinitis: Secondary | ICD-10-CM | POA: Insufficient documentation

## 2011-11-19 NOTE — Assessment & Plan Note (Signed)
Well controlled with only occasional need for rescue inhaler. We discussed this and will refill.

## 2011-11-19 NOTE — Assessment & Plan Note (Signed)
I'm surprised after examining her nose, but she doesn't admit more nasal congestion and nose blowing but she insists she is comfortable. We discussed antihistamines.

## 2012-05-18 ENCOUNTER — Other Ambulatory Visit: Payer: Self-pay | Admitting: Gynecology

## 2012-05-18 DIAGNOSIS — Z1231 Encounter for screening mammogram for malignant neoplasm of breast: Secondary | ICD-10-CM

## 2012-05-21 ENCOUNTER — Encounter (HOSPITAL_COMMUNITY): Payer: Self-pay | Admitting: Emergency Medicine

## 2012-05-21 ENCOUNTER — Emergency Department (HOSPITAL_COMMUNITY)
Admission: EM | Admit: 2012-05-21 | Discharge: 2012-05-21 | Disposition: A | Discharge status: Home / Self Care | Payer: BC Managed Care – PPO | Attending: Emergency Medicine | Admitting: Emergency Medicine

## 2012-05-21 DIAGNOSIS — R42 Dizziness and giddiness: Secondary | ICD-10-CM | POA: Insufficient documentation

## 2012-05-21 DIAGNOSIS — J45909 Unspecified asthma, uncomplicated: Secondary | ICD-10-CM | POA: Insufficient documentation

## 2012-05-21 DIAGNOSIS — Z79899 Other long term (current) drug therapy: Secondary | ICD-10-CM | POA: Insufficient documentation

## 2012-05-21 DIAGNOSIS — Z8742 Personal history of other diseases of the female genital tract: Secondary | ICD-10-CM | POA: Insufficient documentation

## 2012-05-21 DIAGNOSIS — R110 Nausea: Secondary | ICD-10-CM | POA: Insufficient documentation

## 2012-05-21 DIAGNOSIS — L0291 Cutaneous abscess, unspecified: Secondary | ICD-10-CM

## 2012-05-21 DIAGNOSIS — IMO0002 Reserved for concepts with insufficient information to code with codable children: Secondary | ICD-10-CM | POA: Insufficient documentation

## 2012-05-21 MED ORDER — ONDANSETRON HCL 4 MG/2ML IJ SOLN: 4.0000 mg | Freq: Once | INTRAMUSCULAR | Status: DC

## 2012-05-21 MED ORDER — SULFAMETHOXAZOLE-TRIMETHOPRIM 800-160 MG PO TABS: 1.0000 | ORAL_TABLET | Freq: Two times a day (BID) | ORAL | Status: DC

## 2012-05-21 MED ORDER — ACETAMINOPHEN-CODEINE #3 300-30 MG PO TABS: 1.0000 | ORAL_TABLET | Freq: Four times a day (QID) | ORAL | Status: DC | PRN

## 2012-05-21 MED ORDER — SODIUM CHLORIDE 0.9 % IV BOLUS (SEPSIS)
1000.0000 mL | Freq: Once | INTRAVENOUS | Status: AC
  Administered 2012-05-21: 1000 mL via INTRAVENOUS

## 2012-05-21 NOTE — ED Notes (Signed)
Pt states she inadvertently shaved off mole under R arm, area red, swollen with drainage.

## 2012-05-21 NOTE — ED Provider Notes (Signed)
History   This chart was scribed for non-physician practitioner working with Celene Kras, MD by Leone Payor, ED Scribe. This patient was seen in room WTR8/WTR8 and the patient's care was started at 2020.   CSN: [id number]  Arrival date & time 05/21/12  2020   First MD Initiated Contact with Patient 05/21/12 2135      Chief Complaint  Patient presents with  . Abscess     The history is provided by the patient. No language interpreter was used.    Kathryn Davis is a 62 y.o. female who presents to the Emergency Department complaining of a new, constant abscess starting 5 days ago after "shaving off a mole" under right arm. Pt rates the pain as 4/10 currently. States the area has gradually improved after soaking the area in warm water with epsom salts. She has taking OTC pain medication with mild relief. She denies fever, chills.   Pt has h/o asthma, cystocele, back surgery.  Pt denies smoking and alcohol use.  Past Medical History  Diagnosis Date  . Uterine prolapse   . Cystocele   . Asthma   . Vaginal pessary present     Past Surgical History  Procedure Date  . Back surgery 2005    HERNIATED DISC  . Knee arthroscopy 2011    Family History  Problem Relation Age of Onset  . Diabetes Mother   . Osteoporosis Mother   . Breast cancer Sister     Age 40  . Breast cancer Maternal Aunt     Age 69    History  Substance Use Topics  . Smoking status: Never Smoker   . Smokeless tobacco: Not on file  . Alcohol Use: No    OB History    Grav Para Term Preterm Abortions TAB SAB Ect Mult Living   2 2 2       2       Review of Systems A complete 10 system review of systems was obtained and all systems are negative except as noted in the HPI and PMH.    Allergies  Review of patient's allergies indicates no known allergies.  Home Medications   Current Outpatient Rx  Name  Route  Sig  Dispense  Refill  . ALBUTEROL SULFATE HFA 108 (90 BASE) MCG/ACT IN AERS    Inhalation   Inhale 2 puffs into the lungs every 4 (four) hours as needed. Asthma         . ASPIRIN 81 MG PO TABS   Oral   Take 81 mg by mouth.           Marland Kitchen CITALOPRAM HYDROBROMIDE 10 MG PO TABS   Oral   Take 10 mg by mouth daily.         Marland Kitchen GLUCOSAMINE CHONDR COMPLEX PO   Oral   Take 1 tablet by mouth daily.          . MULTIVITAMIN PO   Oral   Take 1 tablet by mouth daily.          Marland Kitchen OVER THE COUNTER MEDICATION   Oral   Take 1 tablet by mouth daily. Mega Red-take 1 by mouth aware         . PSYLLIUM 95 % PO PACK   Oral   Take 1 packet by mouth 2 (two) times daily.           BP 140/69  Pulse 81  Temp 98.1 F (36.7 C) (Oral)  Resp 16  Ht 5\' 5"  (1.651 m)  Wt 200 lb (90.719 kg)  BMI 33.28 kg/m2  SpO2 96%  Physical Exam  Nursing note and vitals reviewed. Constitutional: She is oriented to person, place, and time. She appears well-developed and well-nourished. No distress.  HENT:  Head: Normocephalic and atraumatic.  Eyes: Conjunctivae normal and EOM are normal. Pupils are equal, round, and reactive to light.  Neck: Neck supple. No tracheal deviation present.  Cardiovascular: Normal rate, regular rhythm, normal heart sounds and intact distal pulses.  Exam reveals no gallop and no friction rub.   No murmur heard. Pulmonary/Chest: Effort normal and breath sounds normal. No respiratory distress. She has no wheezes. She has no rales. She exhibits no tenderness.  Musculoskeletal: Normal range of motion.  Neurological: She is alert and oriented to person, place, and time.  Skin: Skin is warm and dry.       1.5cm diameter indurated abscess under R axillary region with surrounding edema and erythema evident of cellulitis. Warm to touch. No drainage.    Psychiatric: She has a normal mood and affect. Her behavior is normal.    ED Course  Procedures (including critical care time) INCISION AND DRAINAGE Performed by: Johnnette Gourd Consent: Verbal consent  obtained. Risks and benefits: risks, benefits and alternatives were discussed Type: abscess  Body area: Below R axillary region  Anesthesia: local infiltration  Incision was made with a scalpel.  Local anesthetic: lidocaine 2% with epinephrine  Anesthetic total: 5 ml  Complexity: complex Blunt dissection to break up loculations  Drainage: purulent  Drainage amount: small  Packing material: none  Patient tolerance: Patient tolerated the procedure well with no immediate complications.    DIAGNOSTIC STUDIES: Oxygen Saturation is 96% on room air, adequate by my interpretation.    COORDINATION OF CARE:  9:45 PM Discussed treatment plan which includes I&D with pt at bedside and pt agreed to plan.    Labs Reviewed - No data to display No results found.   1. Abscess and cellulitis   2. Nausea       MDM  62 year old female with an abscess and surrounding cellulitis. After I&D patient became lightheaded and nauseated. She appeared very pale and was dizzy upon standing. Orthostatic vital signs normal. Fluid bolus given. After fluid bolus patient appeared in no apparent distress and states she felt much better. She was able to get up and ambulating without difficulty. She will be discharged with Tylenol No. 3 and Bactrim. She'll followup with her PCP on Monday. Advised warm compresses. Return precautions discussed. Patient states her understanding of plan and is agreeable.    I personally performed the services described in this documentation, which was scribed in my presence. The recorded information has been reviewed and is accurate.   Trevor Mace, PA-C 05/21/12 2348

## 2012-05-21 NOTE — ED Notes (Signed)
Pt got pale, nauseous and diaphoretic during I&D procedure. Further orders received.

## 2012-05-21 NOTE — ED Notes (Signed)
Pt ambulatory to exam room with steady gait.  

## 2012-05-21 NOTE — ED Notes (Signed)
Pt has red swollen area to R axilla. Pt has area covered with gauze and tape. Pt states there is hard area under the swelling that has been there for a while.

## 2012-05-23 NOTE — ED Provider Notes (Signed)
Medical screening examination/treatment/procedure(s) were performed by non-physician practitioner and as supervising physician I was immediately available for consultation/collaboration.   Celene Kras, MD 05/23/12 [phone]

## 2012-05-24 ENCOUNTER — Ambulatory Visit (INDEPENDENT_AMBULATORY_CARE_PROVIDER_SITE_OTHER): Payer: BC Managed Care – PPO | Admitting: Gynecology

## 2012-05-24 ENCOUNTER — Encounter: Payer: Self-pay | Admitting: Gynecology

## 2012-05-24 VITALS — BP 120/78

## 2012-05-24 DIAGNOSIS — IMO0002 Reserved for concepts with insufficient information to code with codable children: Secondary | ICD-10-CM

## 2012-05-24 DIAGNOSIS — L02411 Cutaneous abscess of right axilla: Secondary | ICD-10-CM | POA: Insufficient documentation

## 2012-05-24 MED ORDER — SULFAMETHOXAZOLE-TRIMETHOPRIM 800-160 MG PO TABS: ORAL_TABLET | ORAL | Status: DC

## 2012-05-24 NOTE — Progress Notes (Signed)
Patient is a 62 year old who presented to the office for followup as a result of an attempted I&D on axillary (right) access in the emergency room on 04/20/2012. Patient was released on the ED and prescribed Bactrim 1 tablet by mouth twice a day for 7 weeks. Patient prior to going to the emergency room had irritated the area while shaving.  Exam: Right axillary inflamed what appears to be a sebaceous cyst that has started to drain purulent material for which a culture from her so was obtained.  The area was cleansed with Betadine solution 1% lidocaine was infiltrated subdermally near the previous I&D site for which the edges were separated and a defect was noted. Fibrinous material was removed and the area was copious irrigated with hydrogen peroxide. The wound was packed with a new gauze for which patient will return tomorrow to have it removed.  Assessment/plan: Right axillary follicular abscess I&D and once again the loculations were broken down the area was debrided and irrigated with hydrogen peroxide and a new gauze wick was placed to be removed tomorrow. She is on the right antibiotic to cover for potential MRSA organism but I we'll extended for one more week. Will explain tomorrow when she returns had to debride the area along with placement of Bactroban antibiotic cream for 2 weeks. Patient had received pain medication in the ED. The area was covered with a gauze dressing.

## 2012-05-24 NOTE — Addendum Note (Signed)
Addended by: Richardson Chiquito on: 05/24/2012 02:21 PM   Modules accepted: Orders

## 2012-05-25 ENCOUNTER — Encounter: Payer: Self-pay | Admitting: Gynecology

## 2012-05-25 ENCOUNTER — Ambulatory Visit (INDEPENDENT_AMBULATORY_CARE_PROVIDER_SITE_OTHER): Payer: BC Managed Care – PPO | Admitting: Gynecology

## 2012-05-25 VITALS — BP 138/90

## 2012-05-25 DIAGNOSIS — L02411 Cutaneous abscess of right axilla: Secondary | ICD-10-CM

## 2012-05-25 DIAGNOSIS — IMO0002 Reserved for concepts with insufficient information to code with codable children: Secondary | ICD-10-CM

## 2012-05-25 MED ORDER — SULFAMETHOXAZOLE-TRIMETHOPRIM 800-160 MG PO TABS: ORAL_TABLET | ORAL | Status: DC

## 2012-05-25 MED ORDER — MUPIROCIN CALCIUM 2 % EX CREA: TOPICAL_CREAM | Freq: Three times a day (TID) | CUTANEOUS | Status: DC

## 2012-05-26 NOTE — Progress Notes (Signed)
Patient is a 62 year old who presented to the office today for debridement of a right axillary abscess. Patient was seen in the office yesterday with the following note:  Patient is a 62 year old who presented to the office for followup as a result of an attempted I&D on axillary (right) access in the emergency room on 04/20/2012. Patient was released on the ED and prescribed Bactrim 1 tablet by mouth twice a day for 7 weeks. Patient prior to going to the emergency room had irritated the area while shaving.  The area was cleansed with Betadine solution 1% lidocaine was infiltrated subdermally near the previous I&D site for which the edges were separated and a defect was noted. Fibrinous material was removed and the area was copious irrigated with hydrogen peroxide.The area was packed with a new gauze wick and she returned today for its removal and debridement. She had originally been placed on Bactrim 1 tablet twice a day for 7 days and was given a prescription for an additional week.  The new gauze wick was removed. The area was once again debrided and copious irrigation with hydration peroxide. The area looked clean the edges slightly erythematous but no preoperative tear was noted. Neosporin was placed on the edges and a bandage was placed.  Patient will continue with the Bactrim one by mouth twice a day to complete a 14 day course. Cultures are pending at this time. She will do irrigation standing in front to shower for 15 minutes and let the water debris the area 2-3 times a day an only apply the Bactroban cream on the outside of the defect and not inside where the abscess was. I explained to her that this will heal by secondary intention from inside out. She will return back to the office in one week for followup. If she has fever chills or the area becomes redder or purulent material begins to drain she'll return to the office before that time for reassessment.

## 2012-05-27 LAB — WOUND CULTURE

## 2012-06-02 ENCOUNTER — Ambulatory Visit (INDEPENDENT_AMBULATORY_CARE_PROVIDER_SITE_OTHER): Payer: BC Managed Care – PPO | Admitting: Gynecology

## 2012-06-02 ENCOUNTER — Encounter: Payer: Self-pay | Admitting: Gynecology

## 2012-06-02 VITALS — BP 130/82

## 2012-06-02 DIAGNOSIS — L253 Unspecified contact dermatitis due to other chemical products: Secondary | ICD-10-CM

## 2012-06-02 DIAGNOSIS — IMO0002 Reserved for concepts with insufficient information to code with codable children: Secondary | ICD-10-CM

## 2012-06-02 MED ORDER — HYDROCORTISONE VALERATE 0.2 % EX OINT: TOPICAL_OINTMENT | Freq: Two times a day (BID) | CUTANEOUS | Status: DC

## 2012-06-02 NOTE — Progress Notes (Signed)
Patient presented to the office today for wound debridement of her right axillary breast abscess. See previous notes from February 10 and February 11 respectively. Patient's currently on Bactrim DS 1 by mouth twice a day and she's currently on day 10 and she's to continue to complete 2 weeks. She has been doing home debridement with antibacterial soap and applying Bactroban cream in addition to taking the oral antibiotic. The culture from the wound demonstrated the following:  Culture  Moderate METHICILLIN RESISTANT STAPHYLOCOCCUS AUREUS   GRAM STAIN  Rare   GRAM STAIN  WBC present-both PMN and Mononuclear   GRAM STAIN  Rare Squamous Epithelial Cells Present   GRAM STAIN  No Organisms Seen   Organism ID, Bacteria  METHICILLIN RESISTANT STAPHYLOCOCCUS AUREUS    The organism was sensitive to Bactrim  A Band-Aid was removed and the large erythematous area is 95% gone a small defect is still present where the incision had been made to debride the wound. The fibrinous material was removed meticulously with fine-tip sterile scissors and the area was copious irrigated with hydrogen peroxide. It appears that she developed a contact dermatitis from the large dressing that she had first few days prior. She will be prescribed Westcort 0.2% hydrocortisone cream to apply to the area twice a day. She will continue at home with debridement as we had discussed and instructed. She will try to keep the area open and only used a Band-Aid when she goes to work. She stated she has had minimal drainage now. She'll return to the office in 2 weeks for final visit.

## 2012-06-02 NOTE — Patient Instructions (Signed)

## 2012-06-07 ENCOUNTER — Ambulatory Visit: Payer: BC Managed Care – PPO

## 2012-06-16 ENCOUNTER — Ambulatory Visit (INDEPENDENT_AMBULATORY_CARE_PROVIDER_SITE_OTHER): Payer: BC Managed Care – PPO | Admitting: Gynecology

## 2012-06-16 ENCOUNTER — Encounter: Payer: Self-pay | Admitting: Gynecology

## 2012-06-16 VITALS — BP 136/88

## 2012-06-16 DIAGNOSIS — IMO0002 Reserved for concepts with insufficient information to code with codable children: Secondary | ICD-10-CM

## 2012-06-16 NOTE — Progress Notes (Signed)
Patient presented to the office today for her final followup visit as a result of right axillary breast abscess that had been I&D and treated with Bactrim by mouth for 2 weeks. The culture had confirmed MRSA and patient was in the appropriate antibiotic. She is asymptomatic today  Exam: Area of the right axilla were previous incision and drainage of abscess has completely healed.  Assessment/plan: Patient no longer needs to apply the Bactroban cream accept at bedtime for 5 more days. She will return back to the office in the summer for her annual gynecological exam. She scheduled for mammogram and approximately 2 weeks.

## 2012-06-28 ENCOUNTER — Ambulatory Visit: Payer: BC Managed Care – PPO

## 2012-07-19 ENCOUNTER — Other Ambulatory Visit: Payer: Self-pay | Admitting: Gynecology

## 2012-07-19 ENCOUNTER — Ambulatory Visit
Admission: RE | Admit: 2012-07-19 | Discharge: 2012-07-19 | Disposition: A | Discharge status: Home / Self Care | Payer: BC Managed Care – PPO | Source: Ambulatory Visit | Attending: Gynecology | Admitting: Gynecology

## 2012-07-19 DIAGNOSIS — Z1231 Encounter for screening mammogram for malignant neoplasm of breast: Secondary | ICD-10-CM

## 2012-10-04 ENCOUNTER — Encounter: Payer: Self-pay | Admitting: Gynecology

## 2012-10-04 ENCOUNTER — Other Ambulatory Visit (HOSPITAL_COMMUNITY)
Admission: RE | Admit: 2012-10-04 | Discharge: 2012-10-04 | Disposition: A | Discharge status: Home / Self Care | Payer: BC Managed Care – PPO | Source: Ambulatory Visit | Attending: Gynecology | Admitting: Gynecology

## 2012-10-04 ENCOUNTER — Ambulatory Visit (INDEPENDENT_AMBULATORY_CARE_PROVIDER_SITE_OTHER): Payer: BC Managed Care – PPO | Admitting: Gynecology

## 2012-10-04 VITALS — BP 128/82 | Ht 64.5 in | Wt 206.5 lb

## 2012-10-04 DIAGNOSIS — N814 Uterovaginal prolapse, unspecified: Secondary | ICD-10-CM

## 2012-10-04 DIAGNOSIS — N951 Menopausal and female climacteric states: Secondary | ICD-10-CM

## 2012-10-04 DIAGNOSIS — N8111 Cystocele, midline: Secondary | ICD-10-CM

## 2012-10-04 DIAGNOSIS — Z780 Asymptomatic menopausal state: Secondary | ICD-10-CM

## 2012-10-04 DIAGNOSIS — Z01419 Encounter for gynecological examination (general) (routine) without abnormal findings: Secondary | ICD-10-CM

## 2012-10-04 DIAGNOSIS — Z1159 Encounter for screening for other viral diseases: Secondary | ICD-10-CM

## 2012-10-04 DIAGNOSIS — IMO0002 Reserved for concepts with insufficient information to code with codable children: Secondary | ICD-10-CM

## 2012-10-04 DIAGNOSIS — R635 Abnormal weight gain: Secondary | ICD-10-CM

## 2012-10-04 DIAGNOSIS — Z1151 Encounter for screening for human papillomavirus (HPV): Secondary | ICD-10-CM | POA: Insufficient documentation

## 2012-10-04 DIAGNOSIS — Z803 Family history of malignant neoplasm of breast: Secondary | ICD-10-CM

## 2012-10-04 LAB — COMPREHENSIVE METABOLIC PANEL
AST: 22 U/L (ref 0–37)
Albumin: 3.9 g/dL (ref 3.5–5.2)
Alkaline Phosphatase: 64 U/L (ref 39–117)
BUN: 15 mg/dL (ref 6–23)
Potassium: 4.2 mEq/L (ref 3.5–5.3)
Sodium: 137 mEq/L (ref 135–145)
Total Bilirubin: 0.3 mg/dL (ref 0.3–1.2)
Total Protein: 6.6 g/dL (ref 6.0–8.3)

## 2012-10-04 LAB — CBC WITH DIFFERENTIAL/PLATELET
Basophils Absolute: 0.1 10*3/uL (ref 0.0–0.1)
Basophils Relative: 1 % (ref 0–1)
Eosinophils Absolute: 0.6 10*3/uL (ref 0.0–0.7)
MCH: 27.8 pg (ref 26.0–34.0)
MCHC: 33.6 g/dL (ref 30.0–36.0)
Neutro Abs: 3.7 10*3/uL (ref 1.7–7.7)
Neutrophils Relative %: 49 % (ref 43–77)
RDW: 14.6 % (ref 11.5–15.5)

## 2012-10-04 LAB — TSH: TSH: 0.729 u[IU]/mL (ref 0.350–4.500)

## 2012-10-04 MED ORDER — NONFORMULARY OR COMPOUNDED ITEM: Status: DC

## 2012-10-04 NOTE — Progress Notes (Signed)
Kathryn Davis 02-21-1951 [id number]   History:    62 y.o.  for annual gyn exam who has a history of uterine prolapse and cystocele She continues to use a ring pessary with support size 5 which gives her excellent results.the patient has not had her pessary cleaned and a year. She denies any pelvic pain. Patient would know prior history of abnormal Pap smears. Her mammogram was April of this year and she had a 3-D as a result of her dense breast which was normal. The patient frequently does her self breast exam. In March of this year patient was treated with incision and drainage of a right axillary breast abscess and has healed completely. She is complaining of some weight gain. She states her Tdap vaccine is up-to-date but she has not had additional flexing it. She is overdue for colonoscopy as been greater than 10 years. Her gastroenterologist is Dr. Matthias Hughs. Patient's sister and aunt on her mother's side had breast cancer. Patient states that her sister was tested for BRCA1 and BRCA2 gene mutation and was negative.   Past medical history,surgical history, family history and social history were all reviewed and documented in the EPIC chart.  Gynecologic History No LMP recorded. Patient is postmenopausal. Contraception: post menopausal status Last Pap: 2012. Results were: normal Last mammogram: 2014. Results were: normal see above  Obstetric History OB History   Grav Para Term Preterm Abortions TAB SAB Ect Mult Living   2 2 2       2      # Outc Date GA Lbr Len/2nd Wgt Sex Del Anes PTL Lv   1 TRM            2 TRM                ROS: A ROS was performed and pertinent positives and negatives are included in the history.  GENERAL: No fevers or chills. HEENT: No change in vision, no earache, sore throat or sinus congestion. NECK: No pain or stiffness. CARDIOVASCULAR: No chest pain or pressure. No palpitations. PULMONARY: No shortness of breath, cough or wheeze. GASTROINTESTINAL: No  abdominal pain, nausea, vomiting or diarrhea, melena or bright red blood per rectum. GENITOURINARY: No urinary frequency, urgency, hesitancy or dysuria. MUSCULOSKELETAL: No joint or muscle pain, no back pain, no recent trauma. DERMATOLOGIC: No rash, no itching, no lesions. ENDOCRINE: No polyuria, polydipsia, no heat or cold intolerance. No recent change in weight. HEMATOLOGICAL: No anemia or easy bruising or bleeding. NEUROLOGIC: No headache, seizures, numbness, tingling or weakness. PSYCHIATRIC: No depression, no loss of interest in normal activity or change in sleep pattern.     Exam: chaperone present  BP 128/82  Ht 5' 4.5" (1.638 m)  Wt 206 lb 8 oz (93.668 kg)  BMI 34.91 kg/m2  Body mass index is 34.91 kg/(m^2).  General appearance : Well developed well nourished female. No acute distress HEENT: Neck supple, trachea midline, no carotid bruits, no thyroidmegaly Lungs: Clear to auscultation, no rhonchi or wheezes, or rib retractions  Heart: Regular rate and rhythm, no murmurs or gallops Breast:Examined in sitting and supine position were symmetrical in appearance, no palpable masses or tenderness,  no skin retraction, no nipple inversion, no nipple discharge, no skin discoloration, no axillary or supraclavicular lymphadenopathy Abdomen: no palpable masses or tenderness, no rebound or guarding Extremities: no edema or skin discoloration or tenderness  Pelvic:  Bartholin, Urethra, Skene Glands: Within normal limits  Vagina: friable and slightly irritated, third-degree cystocele.  Cervix: No gross lesions or discharge  Uterus  axial, normal size, shape and consistency, non-tender and mobile, first degree uterine prolapse  Adnexa  Without masses or tenderness  Anus and perineum  normal   Rectovaginal  normal sphincter tone without palpated masses or tenderness             Hemoccult course will be provided     Assessment/Plan:  62 y.o. female for annual exam  With uterine  prolapse and cystocele. On inspection her vagina was friable. Her pessary was cleaned and estrogen cream was applied vaginally and the pessary was placed back into the vagina. Informed the patient that she should come every 3 months to have a clean tear in the office to avoid any irritation. I have given her prescription for estradiol vaginal cream to apply twice a week. Cold-like her to return back to the office in one month to recheck her vagina. A Pap smear was done today because of the friability. There was no discharge. The following labs were ordered: Screening cholesterol, CBC, TSH, comprehensive metabolic panel, urinalysis as well as  New CDC guidelines is recommending patients be tested once in her lifetime for hepatitis C antibody who were born between [birth year] through 1964. This was discussed with the patient today and has agreed to be tested today.  Prescription for shingles vaccine was provided. She was reminded to schedule an appointment with her gastroenterologist for colonoscopy. She will also schedule a bone density study here in our office since her last bone density study was in 2003 at another facility.     Ok Edwards MD, 2:05 PM 10/04/2012

## 2012-10-04 NOTE — Patient Instructions (Addendum)
Apply vaginal cream twice a week  Hormone Therapy At menopause, your body begins making less estrogen and progesterone hormones. This causes the body to stop having menstrual periods. This is because estrogen and progesterone hormones control your periods and menstrual cycle. A lack of estrogen may cause symptoms such as:  Hot flushes (or hot flashes).  Vaginal dryness.  Dry skin.  Loss of sex drive.  Risk of bone loss (osteoporosis). When this happens, you may choose to take hormone therapy to get back the estrogen lost during menopause. When the hormone estrogen is given alone, it is usually referred to as ET (Estrogen Therapy). When the hormone progestin is combined with estrogen, it is generally called HT (Hormone Therapy). This was formerly known as hormone replacement therapy (HRT). Your caregiver can help you make a decision on what will be best for you. The decision to use HT seems to change often as new studies are done. Many studies do not agree on the benefits of hormone replacement therapy. LIKELY BENEFITS OF HT INCLUDE PROTECTION FROM:  Hot Flushes (also called hot flashes) - A hot flush is a sudden feeling of heat that spreads over the face and body. The skin may redden like a blush. It is connected with sweats and sleep disturbance. Women going through menopause may have hot flushes a few times a month or several times per day depending on the woman.  Osteoporosis (bone loss)- Estrogen helps guard against bone loss. After menopause, a woman's bones slowly lose calcium and become weak and brittle. As a result, bones are more likely to break. The hip, wrist, and spine are affected most often. Hormone therapy can help slow bone loss after menopause. Weight bearing exercise and taking calcium with vitamin D also can help prevent bone loss. There are also medications that your caregiver can prescribe that can help prevent osteoporosis.  Vaginal Dryness - Loss of estrogen causes changes  in the vagina. Its lining may become thin and dry. These changes can cause pain and bleeding during sexual intercourse. Dryness can also lead to infections. This can cause burning and itching. (Vaginal estrogen treatment can help relieve pain, itching, and dryness.)  Urinary Tract Infections are more common after menopause because of lack of estrogen. Some women also develop urinary incontinence because of low estrogen levels in the vagina and bladder.  Possible other benefits of estrogen include a positive effect on mood and short-term memory in women. RISKS AND COMPLICATIONS  Using estrogen alone without progesterone causes the lining of the uterus to grow. This increases the risk of lining of the uterus (endometrial) cancer. Your caregiver should give another hormone called progestin if you have a uterus.  Women who take combined (estrogen and progestin) HT appear to have an increased risk of breast cancer. The risk appears to be small, but increases throughout the time that HT is taken.  Combined therapy also makes the breast tissue slightly denser which makes it harder to read mammograms (breast X-rays).  Combined, estrogen and progesterone therapy can be taken together every day, in which case there may be spotting of blood. HT therapy can be taken cyclically in which case you will have menstrual periods. Cyclically means HT is taken for a set amount of days, then not taken, then this process is repeated.  HT may increase the risk of stroke, heart attack, breast cancer and forming blood clots in your leg.  Transdermal estrogen (estrogen that is absorbed through the skin with a patch or a  cream) may have more positive results with:  Cholesterol.  Blood pressure.  Blood clots. Having the following conditions may indicate you should not have HT:  Endometrial cancer.  Liver disease.  Breast cancer.  Heart disease.  History of blood clots.  Stroke. TREATMENT   If you choose to  take HT and have a uterus, usually estrogen and progestin are prescribed.  Your caregiver will help you decide the best way to take the medications.  Possible ways to take estrogen include:  Pills.  Patches.  Gels.  Sprays.  Vaginal estrogen cream, rings and tablets.  It is best to take the lowest dose possible that will help your symptoms and take them for the shortest period of time that you can.  Hormone therapy can help relieve some of the problems (symptoms) that affect women at menopause. Before making a decision about HT, talk to your caregiver about what is best for you. Be well informed and comfortable with your decisions. HOME CARE INSTRUCTIONS   Follow your caregivers advice when taking the medications.  A Pap test is done to screen for cervical cancer.  The first Pap test should be done at age 30.  Between ages 64 and 73, Pap tests are repeated every 2 years.  Beginning at age 60, you are advised to have a Pap test every 3 years as long as your past 3 Pap tests have been normal.  Some women have medical problems that increase the chance of getting cervical cancer. Talk to your caregiver about these problems. It is especially important to talk to your caregiver if a new problem develops soon after your last Pap test. In these cases, your caregiver may recommend more frequent screening and Pap tests.  The above recommendations are the same for women who have or have not gotten the vaccine for HPV (Human Papillomavirus).  If you had a hysterectomy for a problem that was not a cancer or a condition that could lead to cancer, then you no longer need Pap tests. However, even if you no longer need a Pap test, a regular exam is a good idea to make sure no other problems are starting.   If you are between ages 19 and 74, and you have had normal Pap tests going back 10 years, you no longer need Pap tests. However, even if you no longer need a Pap test, a regular exam is a  good idea to make sure no other problems are starting.   If you have had past treatment for cervical cancer or a condition that could lead to cancer, you need Pap tests and screening for cancer for at least 20 years after your treatment.  If Pap tests have been discontinued, risk factors (such as a new sexual partner) need to be re-assessed to determine if screening should be resumed.  Some women may need screenings more often if they are at high risk for cervical cancer.  Get mammograms done as per the advice of your caregiver. SEEK IMMEDIATE MEDICAL CARE IF:  You develop abnormal vaginal bleeding.  You have pain or swelling in your legs, shortness of breath, or chest pain.  You develop dizziness or headaches.  You have lumps or changes in your breasts or armpits.  You have slurred speech.  You develop weakness or numbness of your arms or legs.  You have pain, burning, or bleeding when urinating.  You develop abdominal pain. Document Released: 12/28/2002 Document Revised: 06/23/2011 Document Reviewed: 04/17/2010 [employer] Patient Information 2014  [employer]

## 2012-10-05 LAB — URINALYSIS W MICROSCOPIC + REFLEX CULTURE
Casts: NONE SEEN
Crystals: NONE SEEN
Glucose, UA: NEGATIVE mg/dL
Hgb urine dipstick: NEGATIVE
Nitrite: NEGATIVE
Specific Gravity, Urine: 1.005 (ref 1.005–1.030)
pH: 6.5 (ref 5.0–8.0)

## 2012-10-06 LAB — URINE CULTURE: Colony Count: 3000

## 2012-10-08 ENCOUNTER — Encounter: Payer: Self-pay | Admitting: Gynecology

## 2012-10-14 ENCOUNTER — Other Ambulatory Visit: Payer: Self-pay | Admitting: Gynecology

## 2012-10-14 ENCOUNTER — Telehealth: Payer: Self-pay | Admitting: *Deleted

## 2012-10-14 ENCOUNTER — Other Ambulatory Visit: Payer: Self-pay | Admitting: Obstetrics and Gynecology

## 2012-10-14 DIAGNOSIS — E7800 Pure hypercholesterolemia, unspecified: Secondary | ICD-10-CM

## 2012-10-14 MED ORDER — CITALOPRAM HYDROBROMIDE 10 MG PO TABS: 10.0000 mg | ORAL_TABLET | Freq: Every day | ORAL | Status: DC

## 2012-10-14 NOTE — Telephone Encounter (Signed)
Pt called requesting Celexa 10 mg 1 po daily. Pt is up to date on annual. Per Dr.G note "She takes Celexa with excellent results for mood swings". On 09/25/11 note. Please advise

## 2012-10-14 NOTE — Telephone Encounter (Signed)
Pt informed rx sent.

## 2012-10-14 NOTE — Telephone Encounter (Signed)
Please call in Celexa 10 mg PO #30 refill x 11

## 2012-11-01 ENCOUNTER — Other Ambulatory Visit: Payer: BC Managed Care – PPO

## 2012-11-01 DIAGNOSIS — E7800 Pure hypercholesterolemia, unspecified: Secondary | ICD-10-CM

## 2012-11-01 LAB — LIPID PANEL
Cholesterol: 255 mg/dL — ABNORMAL HIGH (ref 0–200)
HDL: 40 mg/dL (ref >39)
Total CHOL/HDL Ratio: 6.4 Ratio
Triglycerides: 352 mg/dL — ABNORMAL HIGH (ref <150)

## 2012-11-03 ENCOUNTER — Ambulatory Visit (INDEPENDENT_AMBULATORY_CARE_PROVIDER_SITE_OTHER): Payer: BC Managed Care – PPO | Admitting: Gynecology

## 2012-11-03 ENCOUNTER — Encounter: Payer: Self-pay | Admitting: Gynecology

## 2012-11-03 VITALS — BP 130/78

## 2012-11-03 DIAGNOSIS — N765 Ulceration of vagina: Secondary | ICD-10-CM

## 2012-11-03 DIAGNOSIS — N814 Uterovaginal prolapse, unspecified: Secondary | ICD-10-CM

## 2012-11-03 DIAGNOSIS — N898 Other specified noninflammatory disorders of vagina: Secondary | ICD-10-CM | POA: Insufficient documentation

## 2012-11-03 DIAGNOSIS — IMO0002 Reserved for concepts with insufficient information to code with codable children: Secondary | ICD-10-CM

## 2012-11-03 DIAGNOSIS — N899 Noninflammatory disorder of vagina, unspecified: Secondary | ICD-10-CM

## 2012-11-03 DIAGNOSIS — N8111 Cystocele, midline: Secondary | ICD-10-CM

## 2012-11-03 DIAGNOSIS — T8389XA Other specified complication of genitourinary prosthetic devices, implants and grafts, initial encounter: Secondary | ICD-10-CM

## 2012-11-03 DIAGNOSIS — N72 Inflammatory disease of cervix uteri: Secondary | ICD-10-CM

## 2012-11-03 MED ORDER — NONFORMULARY OR COMPOUNDED ITEM: Status: DC

## 2012-11-03 NOTE — Progress Notes (Signed)
The patient is a 62 year old with history of uterine prolapse and cystocele who has been adamant on any surgical intervention and has done well with a ring pessary for support size 5. When I saw her in the office on June 23 for her annual exam she had not had her pessary cleaned for over a year. She denied any vaginal bleeding.at that office visit the pessary was cleaned and inspection of the vagina demonstrated some slight mucosal irritation and she was instructed to apply the vaginal  Estrogen cream twice a week and to followup with me one month later which is today.  Exam: Bartholin, Urethra, Skene Glands: Within normal limits  Vagina: friable and slightly irritated, third-degree cystocele.  Cervix: No gross lesions or discharge . Irrigation and still presents no major improvement.  Assessment/plan:vaginal irritation as a result of ring pessary. Patient was examined sitting supine position her second-degree cystocele is evident. Patient is not complaining of any urinary incontinence. We are going to keep her off the pessary for 6 weeks. I've asked her to apply the estradiol vaginal cream each bedtime for one week at that time and then 2 times a week thereafter and then to return to the office in 6 weeks for followup. We may need to change to a smaller or different pessary.

## 2012-11-12 ENCOUNTER — Ambulatory Visit: Payer: BC Managed Care – PPO | Admitting: Internal Medicine

## 2012-12-09 ENCOUNTER — Encounter: Payer: Self-pay | Admitting: Internal Medicine

## 2012-12-09 ENCOUNTER — Ambulatory Visit (INDEPENDENT_AMBULATORY_CARE_PROVIDER_SITE_OTHER): Payer: BC Managed Care – PPO | Admitting: Internal Medicine

## 2012-12-09 VITALS — BP 122/76 | HR 73 | Ht 65.0 in | Wt 198.8 lb

## 2012-12-09 DIAGNOSIS — J309 Allergic rhinitis, unspecified: Secondary | ICD-10-CM

## 2012-12-09 DIAGNOSIS — J45909 Unspecified asthma, uncomplicated: Secondary | ICD-10-CM

## 2012-12-09 DIAGNOSIS — J302 Other seasonal allergic rhinitis: Secondary | ICD-10-CM

## 2012-12-09 DIAGNOSIS — J45998 Other asthma: Secondary | ICD-10-CM

## 2012-12-09 MED ORDER — ALBUTEROL SULFATE HFA 108 (90 BASE) MCG/ACT IN AERS: 2.0000 | INHALATION_SPRAY | RESPIRATORY_TRACT | Status: DC | PRN

## 2012-12-09 NOTE — Assessment & Plan Note (Addendum)
Excellent control. She can get future refills for her rescue inhaler through her PCP, unless she needs to come back to [place].

## 2012-12-09 NOTE — Progress Notes (Signed)
Subjective:    Patient ID: Kathryn Davis, female    DOB: [birth year], 62 y.o.   MRN: [id number]  HPI 11/13/10- 78 yoF never smoker followed for asthma, allergic rhinitis Last here May 29, 2010 School building getting a new roof, to fix water leaks. 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 She is optimistic that repair of water leaks will improve the mold and environmental hazard concerns that she has had about the building. Needed rescue inhaler for awhile this spring x 2 weeks with wheeze then. Needs refill. Discussed role of rescue meds.  11/13/11- 60 yoF never smoker followed for asthma, allergic rhinitis Denies any wheezing, cough, congestion, or SOB at this time; denies any trouble with allergies . 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 There is no more mold problem. She needed her metered inhaler only with the spring cold but has not needed maintenance therapy. She credits her years of allergy vaccine in the past for resolving her allergic rhinitis and had no problem this spring.  12/09/12-62 yoF never smoker followed for asthma, allergic rhinitis FOLLOWS FOR: has not had to use inhaler(s); had a good year; denies any wheezing, SOB, cough, or congestion. Retired in July. Only wheeze some with colds- uses rescue HFA then, but not otherwise.  No rhinitis this year.  ROS-see HPI Constitutional:   No-   weight loss, night sweats, fevers, chills, fatigue, lassitude. HEENT:   No-  headaches, difficulty swallowing, tooth/dental problems, sore throat,       No-  sneezing, itching, ear ache, nasal congestion, post nasal drip,  CV:  No-   chest pain, orthopnea, PND, swelling in lower extremities, anasarca, dizziness, palpitations Resp: No-   shortness of breath with exertion or at rest.              No-   productive cough,  No non-productive cough,  No- coughing up of blood.              No-   change in  color of mucus.  No- wheezing.   Skin: No-   rash or lesions. GI:  No-   heartburn, indigestion, abdominal pain, nausea, vomiting,  GU:  MS:  No-   joint pain or swelling.  . Neuro-     nothing unusual Psych:  No- change in mood or affect. No depression or anxiety.  No memory loss.  Objective:   Physical Exam General- Alert, Oriented, Affect-appropriate, Distress- none acute Skin- rash-none, lesions- none, excoriation- none Lymphadenopathy- none Head- atraumatic            Eyes- Gross vision intact, PERRLA, conjunctivae clear secretions            Ears- Hearing, canals normal            Nose- + turbinate edema, No-Septal dev, mucus, polyps, erosion, perforation             Throat- Mallampati III , mucosa clear , drainage- none, tonsils- atrophic Neck- flexible , trachea midline, no stridor , thyroid nl, carotid no bruit Chest - symmetrical excursion , unlabored           Heart/CV- RRR , no murmur , no gallop  , no rub, nl s1 s2                           - JVD- none , edema- none, stasis changes-  none, varices- none           Lung- clear to P&A, wheeze- none, cough- none , dullness-none, rub- none           Chest wall-  Abd-  Br/ Gen/ Rectal- Not done, not indicated Extrem- cyanosis- none, clubbing, none, atrophy- none, strength- nl Neuro- grossly intact to observation  Assessment & Plan:

## 2012-12-09 NOTE — Patient Instructions (Addendum)
Script for rescue inhaler to put on file at the drug store. In the future, your primary care physician can be asked to refill this for you.  Please call if needed

## 2012-12-09 NOTE — Assessment & Plan Note (Signed)
Excellent control.   

## 2012-12-23 ENCOUNTER — Ambulatory Visit: Payer: BC Managed Care – PPO | Admitting: Gynecology

## 2013-01-20 ENCOUNTER — Ambulatory Visit (INDEPENDENT_AMBULATORY_CARE_PROVIDER_SITE_OTHER): Payer: BC Managed Care – PPO

## 2013-01-20 DIAGNOSIS — Z780 Asymptomatic menopausal state: Secondary | ICD-10-CM

## 2013-01-20 DIAGNOSIS — Z13820 Encounter for screening for osteoporosis: Secondary | ICD-10-CM

## 2013-02-03 ENCOUNTER — Ambulatory Visit (INDEPENDENT_AMBULATORY_CARE_PROVIDER_SITE_OTHER): Payer: BC Managed Care – PPO | Admitting: Gynecology

## 2013-02-03 ENCOUNTER — Encounter: Payer: Self-pay | Admitting: Gynecology

## 2013-02-03 VITALS — BP 138/88

## 2013-02-03 DIAGNOSIS — N8111 Cystocele, midline: Secondary | ICD-10-CM

## 2013-02-03 DIAGNOSIS — Z23 Encounter for immunization: Secondary | ICD-10-CM

## 2013-02-03 NOTE — Patient Instructions (Signed)
Influenza Vaccine (Flu Vaccine, Inactivated) 2013 2014 What You Need to Know WHY GET VACCINATED?  Influenza ("flu") is a contagious disease that spreads around the [place] every winter, usually between October and May.  Flu is caused by the influenza virus, and can be spread by coughing, sneezing, and close contact.  Anyone can get flu, but the risk of getting flu is highest among children. Symptoms come on suddenly and may last several days. They can include:  Fever or chills.  Sore throat.  Muscle aches.  Fatigue.  Cough.  Headache.  Runny or stuffy nose. Flu can make some people much sicker than others. These people include young children, people 65 and older, pregnant women, and people with certain health conditions such as heart, lung or kidney disease, or a weakened immune system. Flu vaccine is especially important for these people, and anyone in close contact with them. Flu can also lead to pneumonia, and make existing medical conditions worse. It can cause diarrhea and seizures in children. Each year thousands of people in the [place] die from flu, and many more are hospitalized. Flu vaccine is the best protection we have from flu and its complications. Flu vaccine also helps prevent spreading flu from person to person. INACTIVATED FLU VACCINE There are 2 types of influenza vaccine:  You are getting an inactivated flu vaccine, which does not contain any live influenza virus. It is given by injection with a needle, and often called the "flu shot."  A different live, attenuated (weakened) influenza vaccine is sprayed into the nostrils. This vaccine is described in a separate Vaccine Information Statement. Flu vaccine is recommended every year. Children 6 months through 8 years of age should get 2 doses the first year they get vaccinated. Flu viruses are always changing. Each year's flu vaccine is made to protect from viruses that are most likely to cause disease  that year. While flu vaccine cannot prevent all cases of flu, it is our best defense against the disease. Inactivated flu vaccine protects against 3 or 4 different influenza viruses. It takes about 2 weeks for protection to develop after the vaccination, and protection lasts several months to a year. Some illnesses that are not caused by influenza virus are often mistaken for flu. Flu vaccine will not prevent these illnesses. It can only prevent influenza. A "high-dose" flu vaccine is available for people 65 years of age and older. The person giving you the vaccine can tell you more about it. Some inactivated flu vaccine contains a very small amount of a mercury-based preservative called thimerosal. Studies have shown that thimerosal in vaccines is not harmful, but flu vaccines that do not contain a preservative are available. SOME PEOPLE SHOULD NOT GET THIS VACCINE Tell the person who gives you the vaccine:  If you have any severe (life-threatening) allergies. If you ever had a life-threatening allergic reaction after a dose of flu vaccine, or have a severe allergy to any part of this vaccine, you may be advised not to get a dose. Most, but not all, types of flu vaccine contain a small amount of egg.  If you ever had Guillain Barr Syndrome (a severe paralyzing illness, also called GBS). Some people with a history of GBS should not get this vaccine. This should be discussed with your doctor.  If you are not feeling well. They might suggest waiting until you feel better. But you should come back. RISKS OF A VACCINE REACTION With a vaccine, like any medicine, there   is a chance of side effects. These are usually mild and go away on their own. Serious side effects are also possible, but are very rare. Inactivated flu vaccine does not contain live flu virus, sogetting flu from this vaccine is not possible. Brief fainting spells and related symptoms (such as jerking movements) can happen after any medical  procedure, including vaccination. Sitting or lying down for about 15 minutes after a vaccination can help prevent fainting and injuries caused by falls. Tell your doctor if you feel dizzy or lightheaded, or have vision changes or ringing in the ears. Mild problems following inactivated flu vaccine:  Soreness, redness, or swelling where the shot was given.  Hoarseness; sore, red or itchy eyes; or cough.  Fever.  Aches.  Headache.  Itching.  Fatigue. If these problems occur, they usually begin soon after the shot and last 1 or 2 days. Moderate problems following inactivated flu vaccine:  Young children who get inactivated flu vaccine and pneumococcal vaccine (PCV13) at the same time may be at increased risk for seizures caused by fever. Ask your doctor for more information. Tell your doctor if a child who is getting flu vaccine has ever had a seizure. Severe problems following inactivated flu vaccine:  A severe allergic reaction could occur after any vaccine (estimated less than 1 in a million doses).  There is a small possibility that inactivated flu vaccine could be associated with Guillan Barr Syndrome (GBS), no more than 1 or 2 cases per million people vaccinated. This is much lower than the risk of severe complications from flu, which can be prevented by flu vaccine. The safety of vaccines is always being monitored. For more information, visit: www.cdc.gov/vaccinesafety/ WHAT IF THERE IS A SERIOUS REACTION? What should I look for?  Look for anything that concerns you, such as signs of a severe allergic reaction, very high fever, or behavior changes. Signs of a severe allergic reaction can include hives, swelling of the face and throat, difficulty breathing, a fast heartbeat, dizziness, and weakness. These would start a few minutes to a few hours after the vaccination. What should I do?  If you think it is a severe allergic reaction or other emergency that cannot wait, call 9 1 1  or get the person to the nearest hospital. Otherwise, call your doctor.  Afterward, the reaction should be reported to the Vaccine Adverse Event Reporting System (VAERS). Your doctor might file this report, or you can do it yourself through the VAERS website at www.vaers.hhs.gov, or by calling 1-800-822-7967. VAERS is only for reporting reactions. They do not give medical advice. THE NATIONAL VACCINE INJURY COMPENSATION PROGRAM The National Vaccine Injury Compensation Program (VICP) is a federal program that was created to compensate people who may have been injured by certain vaccines. Persons who believe they may have been injured by a vaccine can learn about the program and about filing a claim by calling 1-800-338-2382 or visiting the VICP website at www.hrsa.gov/vaccinecompensation HOW CAN I LEARN MORE?  Ask your doctor.  Call your local or state health department.  Contact the Centers for Disease Control and Prevention (CDC):  Call 1-800-232-4636 (1-800-CDC-INFO) or  Visit CDC's website at www.cdc.gov/flu CDC Inactivated Influenza Vaccine Interim VIS (11/07/11) Document Released: 01/23/2006 Document Revised: 12/24/2011 Document Reviewed: 11/07/2011 [employer] Patient Information 2014 [employer].  

## 2013-02-03 NOTE — Addendum Note (Signed)
Addended by: Bertram Savin A on: 02/03/2013 09:29 AM   Modules accepted: Orders

## 2013-02-03 NOTE — Progress Notes (Signed)
Patient presented to the office for followup. Patient's history is as follows:  62 year old with history of uterine prolapse and cystocele who has been adamant on any surgical intervention and has done well with a ring pessary for support size 5. When I saw her in the office on June 23 for her annual exam she had not had her pessary cleaned for over a year. She denied any vaginal bleeding.at that office visit the pessary was cleaned and inspection of the vagina demonstrated some slight mucosal irritation. She was seen in the office on July 23 and was instructed to stay off the pessary for 6 weeks and to return to the office today. She was started on estrogen vaginal cream each bedtime for one week and then twice a week or after. Patient is asymptomatic and states that she is doing well without the pessary.  Exam: Bartholin urethra Skene was within normal limits Vaginal mucosa while lubricated, third-degree cystocele Right vaginal fornix granulation-like tissue from previous irritation from 1 year of continuous pessary usage. Silver nitrate was applied to the area. Cervix: No lesions or discharge  Assessment/plan:patient with third degree cystocele no stress urinary incontinence. Patient would prefer not to wear the pessary anymore. She will be given literature and information on Kegel exercises. She will continue to use the vaginal estrogen cream twice a week. Patient requesting to have the flu vaccine today. We'll see her back next year for her annual exam or when necessary.

## 2014-01-19 ENCOUNTER — Other Ambulatory Visit: Payer: Self-pay

## 2014-01-19 DIAGNOSIS — Z1231 Encounter for screening mammogram for malignant neoplasm of breast: Secondary | ICD-10-CM

## 2014-02-02 ENCOUNTER — Ambulatory Visit
Admission: RE | Admit: 2014-02-02 | Discharge: 2014-02-02 | Disposition: A | Discharge status: Home / Self Care | Payer: BC Managed Care – PPO | Source: Ambulatory Visit

## 2014-02-02 ENCOUNTER — Encounter (INDEPENDENT_AMBULATORY_CARE_PROVIDER_SITE_OTHER): Payer: Self-pay

## 2014-02-02 DIAGNOSIS — Z1231 Encounter for screening mammogram for malignant neoplasm of breast: Secondary | ICD-10-CM

## 2014-02-13 ENCOUNTER — Encounter: Payer: Self-pay | Admitting: Gynecology

## 2014-12-24 ENCOUNTER — Emergency Department (HOSPITAL_COMMUNITY)
Admission: EM | Admit: 2014-12-24 | Discharge: 2014-12-24 | Disposition: A | Discharge status: Home / Self Care | Payer: BC Managed Care – PPO | Attending: Emergency Medicine | Admitting: Emergency Medicine

## 2014-12-24 ENCOUNTER — Emergency Department (HOSPITAL_COMMUNITY): Payer: BC Managed Care – PPO

## 2014-12-24 ENCOUNTER — Encounter (HOSPITAL_COMMUNITY): Payer: Self-pay | Admitting: Emergency Medicine

## 2014-12-24 DIAGNOSIS — Y9389 Activity, other specified: Secondary | ICD-10-CM | POA: Diagnosis not present

## 2014-12-24 DIAGNOSIS — Z8742 Personal history of other diseases of the female genital tract: Secondary | ICD-10-CM | POA: Insufficient documentation

## 2014-12-24 DIAGNOSIS — Z23 Encounter for immunization: Secondary | ICD-10-CM | POA: Diagnosis not present

## 2014-12-24 DIAGNOSIS — W25XXXA Contact with sharp glass, initial encounter: Secondary | ICD-10-CM | POA: Insufficient documentation

## 2014-12-24 DIAGNOSIS — Y9289 Other specified places as the place of occurrence of the external cause: Secondary | ICD-10-CM | POA: Insufficient documentation

## 2014-12-24 DIAGNOSIS — Z79899 Other long term (current) drug therapy: Secondary | ICD-10-CM | POA: Diagnosis not present

## 2014-12-24 DIAGNOSIS — J45909 Unspecified asthma, uncomplicated: Secondary | ICD-10-CM | POA: Insufficient documentation

## 2014-12-24 DIAGNOSIS — Y998 Other external cause status: Secondary | ICD-10-CM | POA: Insufficient documentation

## 2014-12-24 DIAGNOSIS — S61219A Laceration without foreign body of unspecified finger without damage to nail, initial encounter: Secondary | ICD-10-CM

## 2014-12-24 DIAGNOSIS — S61215A Laceration without foreign body of left ring finger without damage to nail, initial encounter: Secondary | ICD-10-CM | POA: Diagnosis not present

## 2014-12-24 DIAGNOSIS — Z7982 Long term (current) use of aspirin: Secondary | ICD-10-CM | POA: Diagnosis not present

## 2014-12-24 DIAGNOSIS — S6992XA Unspecified injury of left wrist, hand and finger(s), initial encounter: Secondary | ICD-10-CM | POA: Diagnosis present

## 2014-12-24 MED ORDER — LIDOCAINE HCL (PF) 1 % IJ SOLN
10.0000 mL | Freq: Once | INTRAMUSCULAR | Status: DC
  Filled 2014-12-24: qty 10

## 2014-12-24 MED ORDER — LIDOCAINE HCL 1 % IJ SOLN
INTRAMUSCULAR | Status: AC
  Administered 2014-12-24: 6 mL
  Filled 2014-12-24: qty 20

## 2014-12-24 MED ORDER — TETANUS-DIPHTH-ACELL PERTUSSIS 5-2.5-18.5 LF-MCG/0.5 IM SUSP
0.5000 mL | Freq: Once | INTRAMUSCULAR | Status: AC
  Administered 2014-12-24: 0.5 mL via INTRAMUSCULAR
  Filled 2014-12-24: qty 0.5

## 2014-12-24 NOTE — ED Notes (Signed)
Reviewed d/c information with pt and she voiced understanding. Pt departed under her own power and in NAD.

## 2014-12-24 NOTE — ED Notes (Signed)
Pt from home c/o left ring finger laceration. Pt reports she was trying to cut on the lamp, it fell and broke and she cut her finger. Bandage in place and was not removed. Bleeding appears to be controlled, no blood coming through bandage.

## 2014-12-24 NOTE — ED Provider Notes (Signed)
CSN: [id number]     Arrival date & time 12/24/14  0510 History   First MD Initiated Contact with Patient 12/24/14 0848     Chief Complaint  Patient presents with  . finger laceration      HPI   Kathryn Davis is a 64 y.o. female who presents to the ED with laceration to left fourth finger. Reports she woke up this morning around 4:00AM to turn off a lamp, which broke and the glass cut her finger. She states she rinsed her finger with cold water and applied pressure to stop the bleeding. She denies significant pain, numbness, paresthesia, weakness, decreased range of motion. States her last tetanus was 6 years ago. Denies anticoagulant use.   Past Medical History  Diagnosis Date  . Uterine prolapse   . Cystocele   . Asthma   . Vaginal pessary present    Past Surgical History  Procedure Laterality Date  . Back surgery  2005    HERNIATED DISC  . Knee arthroscopy  2011   Family History  Problem Relation Age of Onset  . Diabetes Mother   . Osteoporosis Mother   . Breast cancer Sister     Age 20  . Breast cancer Maternal Aunt     Age 56   Social History  Substance Use Topics  . Smoking status: Never Smoker   . Smokeless tobacco: None  . Alcohol Use: No   OB History    Gravida Para Term Preterm AB TAB SAB Ectopic Multiple Living   2 2 2       2       Review of Systems  Constitutional: Negative for fever, chills, activity change, appetite change and fatigue.  Respiratory: Negative for shortness of breath.   Cardiovascular: Negative for chest pain and palpitations.  Gastrointestinal: Negative for nausea, vomiting, abdominal pain, diarrhea and constipation.  Skin: Positive for wound. Negative for color change, pallor and rash.  Neurological: Negative for weakness and numbness.  All other systems reviewed and are negative.     Allergies  Review of patient's allergies indicates no known allergies.  Home Medications   Prior to Admission medications    Medication Sig Start Date End Date Taking? Authorizing Provider  albuterol (PROVENTIL HFA;VENTOLIN HFA) 108 (90 BASE) MCG/ACT inhaler Inhale 2 puffs into the lungs every 4 (four) hours as needed. Asthma 12/09/12 01/05/14  Deneise Lever, MD  aspirin 81 MG tablet Take 81 mg by mouth.      Historical Provider, MD  citalopram (CELEXA) 10 MG tablet Take 1 tablet (10 mg total) by mouth daily. 10/14/12   Terrance Mass, MD  Glucosamine-Chondroitin (GLUCOSAMINE CHONDR COMPLEX PO) Take 1 tablet by mouth daily.     Historical Provider, MD  Multiple Vitamin (MULTIVITAMIN PO) Take 1 tablet by mouth daily.     Historical Provider, MD  NONFORMULARY OR COMPOUNDED ITEM Estradiol .02% 1 ML Prefilled Applicator Sig: apply vaginally twice a week #90 Day Supply with 4 refills 11/03/12   Terrance Mass, MD  OVER THE COUNTER MEDICATION Take 1 tablet by mouth daily. Mega Red-take 1 by mouth aware    Historical Provider, MD  psyllium (HYDROCIL/METAMUCIL) 95 % PACK Take 1 packet by mouth 2 (two) times daily.    Historical Provider, MD    BP 170/90 mmHg  Pulse 67  Temp(Src) 98.3 F (36.8 C) (Oral)  Resp 15  SpO2 98% Physical Exam  Constitutional: She is oriented to person, place, and time. She appears  well-developed and well-nourished. No distress.  HENT:  Head: Normocephalic and atraumatic.  Right Ear: External ear normal.  Left Ear: External ear normal.  Nose: Nose normal.  Mouth/Throat: Uvula is midline, oropharynx is clear and moist and mucous membranes are normal.  Eyes: Conjunctivae, EOM and lids are normal. Pupils are equal, round, and reactive to light. Right eye exhibits no discharge. Left eye exhibits no discharge. No scleral icterus.  Neck: Normal range of motion. Neck supple.  Cardiovascular: Normal rate, regular rhythm, normal heart sounds, intact distal pulses and normal pulses.   Pulmonary/Chest: Effort normal and breath sounds normal. No respiratory distress.  Abdominal: Soft. Normal  appearance and bowel sounds are normal. She exhibits no distension and no mass. There is no tenderness. There is no rigidity, no rebound and no guarding.  Musculoskeletal: Normal range of motion. She exhibits no edema or tenderness.  Neurological: She is alert and oriented to person, place, and time. She has normal strength. No cranial nerve deficit or sensory deficit.  Skin: Skin is warm, dry and intact. No rash noted. She is not diaphoretic. No erythema. No pallor.  1 cm superficial laceration to palmar aspect of left fourth finger, hemostatic.  Psychiatric: She has a normal mood and affect. Her speech is normal and behavior is normal. Judgment and thought content normal.  Nursing note and vitals reviewed.   ED Course  LACERATION REPAIR Date/Time: 12/24/2014 6:40 PM Performed by: Marella Chimes Authorized by: Bernerd Limbo C Consent: Verbal consent obtained. Risks and benefits: risks, benefits and alternatives were discussed Consent given by: patient Patient understanding: patient states understanding of the procedure being performed Patient consent: the patient's understanding of the procedure matches consent given Procedure consent: procedure consent matches procedure scheduled Relevant documents: relevant documents present and verified Site marked: the operative site was marked Patient identity confirmed: verbally with patient and arm band Time out: Immediately prior to procedure a "time out" was called to verify the correct patient, procedure, equipment, support staff and site/side marked as required. Body area: upper extremity Location details: left ring finger Laceration length: 1 cm Foreign bodies: no foreign bodies Tendon involvement: none Nerve involvement: none Vascular damage: no Anesthesia: digital block Local anesthetic: lidocaine 1% without epinephrine Anesthetic total: 8 ml Patient sedated: no Preparation: Patient was prepped and draped in the usual  sterile fashion. Irrigation solution: saline Irrigation method: tap Amount of cleaning: standard Debridement: none Degree of undermining: none Skin closure: 4-0 Prolene Number of sutures: 1 Technique: simple Approximation: close Approximation difficulty: simple Dressing: 4x4 sterile gauze Patient tolerance: Patient tolerated the procedure well with no immediate complications   (including critical care time)  Labs Review Labs Reviewed - No data to display  Imaging Review Dg Finger Ring Left  12/24/2014   CLINICAL DATA:  Laceration to the left ring finger tip.  EXAM: LEFT RING FINGER 2+V  COMPARISON:  None.  FINDINGS: There is no evidence of fracture or dislocation. There is no evidence of arthropathy or other focal bone abnormality. Soft tissues are unremarkable. No radiopaque soft tissue foreign bodies.  IMPRESSION: Negative.   Electronically Signed   By: Lucienne Capers M.D.   On: 12/24/2014 06:37   I have personally reviewed and evaluated these lab results as part of my medical decision-making.   EKG Interpretation None      MDM   Final diagnoses:  Finger laceration, initial encounter    64 year old female presents with laceration to left fourth finger. Denies numbness, paresthesia. 1  cm superficial laceration to palmar aspect of left fourth finger, hemostatic. Patient is neurovascularly intact. Imaging negative for fracture, dislocation, or radio-opaque soft tissue foreign body. Laceration repaired in the ED. Patient to follow-up with PCP for suture removal. Tetanus updated. Return precautions discussed.  BP 144/80 mmHg  Pulse 67  Temp(Src) 98.3 F (36.8 C) (Oral)  Resp 14  SpO2 99%     Marella Chimes, PA-C 12/24/14 1843  Carmin Muskrat, MD 12/25/14 [phone]

## 2014-12-24 NOTE — ED Notes (Signed)
Bed: WA07 Expected date:  Expected time:  Means of arrival:  Comments: 

## 2014-12-24 NOTE — Discharge Instructions (Signed)
1. Medications: usual home medications 2. Treatment: rest, drink plenty of fluids 3. Follow Up: please followup with your primary doctor in 1 week for suture removal; please return to the ER for severe pain, bleeding, fever, redness, heat, discharge from finger, new or worsening symptoms   Laceration Care, Adult A laceration is a cut or lesion that goes through all layers of the skin and into the tissue just beneath the skin. TREATMENT  Some lacerations may not require closure. Some lacerations may not be able to be closed due to an increased risk of infection. It is important to see your caregiver as soon as possible after an injury to minimize the risk of infection and maximize the opportunity for successful closure. If closure is appropriate, pain medicines may be given, if needed. The wound will be cleaned to help prevent infection. Your caregiver will use stitches (sutures), staples, wound glue (adhesive), or skin adhesive strips to repair the laceration. These tools bring the skin edges together to allow for faster healing and a better cosmetic outcome. However, all wounds will heal with a scar. Once the wound has healed, scarring can be minimized by covering the wound with sunscreen during the day for 1 full year. HOME CARE INSTRUCTIONS  For sutures or staples:  Keep the wound clean and dry.  If you were given a bandage (dressing), you should change it at least once a day. Also, change the dressing if it becomes wet or dirty, or as directed by your caregiver.  Wash the wound with soap and water 2 times a day. Rinse the wound off with water to remove all soap. Pat the wound dry with a clean towel.  After cleaning, apply a thin layer of the antibiotic ointment as recommended by your caregiver. This will help prevent infection and keep the dressing from sticking.  You may shower as usual after the first 24 hours. Do not soak the wound in water until the sutures are removed.  Only take  over-the-counter or prescription medicines for pain, discomfort, or fever as directed by your caregiver.  Get your sutures or staples removed as directed by your caregiver. For skin adhesive strips:  Keep the wound clean and dry.  Do not get the skin adhesive strips wet. You may bathe carefully, using caution to keep the wound dry.  If the wound gets wet, pat it dry with a clean towel.  Skin adhesive strips will fall off on their own. You may trim the strips as the wound heals. Do not remove skin adhesive strips that are still stuck to the wound. They will fall off in time. For wound adhesive:  You may briefly wet your wound in the shower or bath. Do not soak or scrub the wound. Do not swim. Avoid periods of heavy perspiration until the skin adhesive has fallen off on its own. After showering or bathing, gently pat the wound dry with a clean towel.  Do not apply liquid medicine, cream medicine, or ointment medicine to your wound while the skin adhesive is in place. This may loosen the film before your wound is healed.  If a dressing is placed over the wound, be careful not to apply tape directly over the skin adhesive. This may cause the adhesive to be pulled off before the wound is healed.  Avoid prolonged exposure to sunlight or tanning lamps while the skin adhesive is in place. Exposure to ultraviolet light in the first year will darken the scar.  The skin adhesive  will usually remain in place for 5 to 10 days, then naturally fall off the skin. Do not pick at the adhesive film. You may need a tetanus shot if:  You cannot remember when you had your last tetanus shot.  You have never had a tetanus shot. If you get a tetanus shot, your arm may swell, get red, and feel warm to the touch. This is common and not a problem. If you need a tetanus shot and you choose not to have one, there is a rare chance of getting tetanus. Sickness from tetanus can be serious. SEEK MEDICAL CARE IF:   You  have redness, swelling, or increasing pain in the wound.  You see a red line that goes away from the wound.  You have yellowish-white fluid (pus) coming from the wound.  You have a fever.  You notice a bad smell coming from the wound or dressing.  Your wound breaks open before or after sutures have been removed.  You notice something coming out of the wound such as wood or glass.  Your wound is on your hand or foot and you cannot move a finger or toe. SEEK IMMEDIATE MEDICAL CARE IF:   Your pain is not controlled with prescribed medicine.  You have severe swelling around the wound causing pain and numbness or a change in color in your arm, hand, leg, or foot.  Your wound splits open and starts bleeding.  You have worsening numbness, weakness, or loss of function of any joint around or beyond the wound.  You develop painful lumps near the wound or on the skin anywhere on your body. MAKE SURE YOU:   Understand these instructions.  Will watch your condition.  Will get help right away if you are not doing well or get worse. Document Released: 03/31/2005 Document Revised: 06/23/2011 Document Reviewed: 09/24/2010 [facility] Patient Information 2015 [place], [place]. This information is not intended to replace advice given to you by your health care provider. Make sure you discuss any questions you have with your health care provider.

## 2015-01-04 ENCOUNTER — Other Ambulatory Visit: Payer: Self-pay | Admitting: Physician Assistant

## 2015-01-04 DIAGNOSIS — N6315 Unspecified lump in the right breast, overlapping quadrants: Secondary | ICD-10-CM

## 2015-01-04 DIAGNOSIS — N6310 Unspecified lump in the right breast, unspecified quadrant: Principal | ICD-10-CM

## 2015-01-15 ENCOUNTER — Other Ambulatory Visit: Payer: Self-pay | Admitting: Physician Assistant

## 2015-01-15 ENCOUNTER — Ambulatory Visit
Admission: RE | Admit: 2015-01-15 | Discharge: 2015-01-15 | Disposition: A | Discharge status: Home / Self Care | Payer: BC Managed Care – PPO | Source: Ambulatory Visit | Attending: Physician Assistant | Admitting: Physician Assistant

## 2015-01-15 DIAGNOSIS — N6315 Unspecified lump in the right breast, overlapping quadrants: Secondary | ICD-10-CM

## 2015-01-15 DIAGNOSIS — N6310 Unspecified lump in the right breast, unspecified quadrant: Principal | ICD-10-CM

## 2016-01-01 ENCOUNTER — Other Ambulatory Visit: Payer: Self-pay | Admitting: Family Medicine

## 2016-01-01 DIAGNOSIS — Z1231 Encounter for screening mammogram for malignant neoplasm of breast: Secondary | ICD-10-CM

## 2016-02-08 ENCOUNTER — Ambulatory Visit
Admission: RE | Admit: 2016-02-08 | Discharge: 2016-02-08 | Disposition: A | Discharge status: Home / Self Care | Payer: BC Managed Care – PPO | Source: Ambulatory Visit | Attending: Family Medicine | Admitting: Family Medicine

## 2016-02-08 DIAGNOSIS — Z1231 Encounter for screening mammogram for malignant neoplasm of breast: Secondary | ICD-10-CM

## 2016-08-27 ENCOUNTER — Encounter: Payer: Self-pay | Admitting: Gynecology

## 2017-01-29 ENCOUNTER — Encounter: Payer: Self-pay | Admitting: Obstetrics & Gynecology

## 2017-01-29 ENCOUNTER — Ambulatory Visit (INDEPENDENT_AMBULATORY_CARE_PROVIDER_SITE_OTHER): Payer: Medicare Other | Admitting: Obstetrics & Gynecology

## 2017-01-29 VITALS — BP 130/70

## 2017-01-29 DIAGNOSIS — N8110 Cystocele, unspecified: Secondary | ICD-10-CM | POA: Diagnosis not present

## 2017-01-29 NOTE — Progress Notes (Signed)
    Kathryn Davis [birth year] [id number]        66 y.o.  G2P2002   RP:  Bulging at vagina  HPI:  Was doing well not using the pessary since 2014, but recently felt a bulge at vagina which was uncomfortable.  Tried to insert her Milex #5 ring with support, but it is not staying in.  It falls out as soon as she moves around.  No SUI.  Normal mictions.  BMs wnl.  Annual/gyn exams with Fam MD.    Past medical history,surgical history, problem list, medications, allergies, family history and social history were all reviewed and documented in the EPIC chart.  Directed ROS w      ith pertinent positives and negatives documented in the history of present illness/assessment and plan.  Exam:  Vitals:   01/29/17 1426  BP: 130/70   General appearance:  Normal  Gyn exam:  Vulva normal.  Bimanual exam:  Uterus AV, normal volume, mobile, NT.  No adnexal mass.  Exam in standing position with valsalva:  Cystocele grade 4/4.  No uterine prolapse, no rectocele.    Pessary fitting:  Milex # 6 and #5 Rings with support are both too large.  Milex #4 Ring with support is the perfect size.  Holding well with movement and pushing efforts.   Assessment/Plan:  66 y.o. G2P2002   1. Baden-Walker grade 4 cystocele Prefers pessary, declines surgical correction at this time.  Bladder function with and without pessary in the context of a Cystocele discussed.  Milex #4 Ring with support fits perfectly.  Didn't fall off with squatting and going to the bathroom.  Ordered for patient.  Will come back for insertion when we receive it.                                                                               Counseling on above issue >50% x 25 minutes.                                                 [name] MD, 2:55 PM 01/29/2017

## 2017-01-29 NOTE — Patient Instructions (Addendum)
1. Baden-Walker grade 4 cystocele Prefers pessary, declines surgical correction at this time.  Bladder function with and without pessary in the context of a Cystocele discussed.  Milex #4 Ring with support fits perfectly.  Didn't fall off with squatting and going to the bathroom.  Ordered for patient.  Will come back for insertion when we receive it.                                                                               Kathryn Davis, it was a pleasure meeting you today!  We will call you as soon as your pessary arrives.   Pelvic Organ Prolapse Pelvic organ prolapse is the stretching, bulging, or dropping of pelvic organs into an abnormal position. It happens when the muscles and tissues that surround and support pelvic structures are stretched or weak. Pelvic organ prolapse can involve:  Vagina (vaginal prolapse).  Uterus (uterine prolapse).  Bladder (cystocele).  Rectum (rectocele).  Intestines (enterocele).  When organs other than the vagina are involved, they often bulge into the vagina or protrude from the vagina, depending on how severe the prolapse is. What are the causes? Causes of this condition include:  Pregnancy, labor, and childbirth.  Long-lasting (chronic) cough.  Chronic constipation.  Obesity.  Past pelvic surgery.  Aging. During and after menopause, a decreased production of the hormone estrogen can weaken pelvic ligaments and muscles.  Consistently lifting more than 50 lb (23 kg).  Buildup of fluid in the abdomen due to certain diseases and other conditions.  What are the signs or symptoms? Symptoms of this condition include:  Loss of bladder control when you cough, sneeze, strain, and exercise (stress incontinence). This may be worse immediately following childbirth, and it may gradually improve over time.  Feeling pressure in your pelvis or vagina. This pressure may increase when you cough or when you are having a bowel movement.  A bulge that  protrudes from the opening of your vagina or against your vaginal wall. If your uterus protrudes through the opening of your vagina and rubs against your clothing, you may also experience soreness, ulcers, infection, pain, and bleeding.  Increased effort to have a bowel movement or urinate.  Pain in your low back.  Pain, discomfort, or disinterest in sexual intercourse.  Repeated bladder infections (urinary tract infections).  Difficulty inserting or inability to insert a tampon or applicator.  In some people, this condition does not cause any symptoms. How is this diagnosed? Your health care provider may perform an internal and external vaginal and rectal exam. During the exam, you may be asked to cough and strain while you are lying down, sitting, and standing up. Your health care provider will determine if other tests are required, such as bladder function tests. How is this treated? In most cases, this condition needs to be treated only if it produces symptoms. No treatment is guaranteed to correct the prolapse or relieve the symptoms completely. Treatment may include:  Lifestyle changes, such as: ? Avoiding drinking beverages that contain caffeine. ? Increasing your intake of high-fiber foods. This can help to decrease constipation and straining during bowel movements. ? Emptying your bladder at scheduled times (bladder training therapy). This  can help to reduce or avoid urinary incontinence. ? Losing weight if you are overweight or obese.  Estrogen. Estrogen may help mild prolapse by increasing the strength and tone of pelvic floor muscles.  Kegel exercises. These may help mild cases of prolapse by strengthening and tightening the muscles of the pelvic floor.  Pessary insertion. A pessary is a soft, flexible device that is placed into your vagina by your health care provider to help support the vaginal walls and keep pelvic organs in place.  Surgery. This is often the only form of  treatment for severe prolapse. Different types of surgeries are available.  Follow these instructions at home:  Wear a sanitary pad or absorbent product if you have urinary incontinence.  Avoid heavy lifting and straining with exercise and work. Do not hold your breath when you perform mild to moderate lifting and exercise activities. Limit your activities as directed by your health care provider.  Take medicines only as directed by your health care provider.  Perform Kegel exercises as directed by your health care provider.  If you have a pessary, take care of it as directed by your health care provider. Contact a health care provider if:  Your symptoms interfere with your daily activities or sex life.  You need medicine to help with the discomfort.  You notice bleeding from the vagina that is not related to your period.  You have a fever.  You have pain or bleeding when you urinate.  You have bleeding when you have a bowel movement.  You lose urine when you have sex.  You have chronic constipation.  You have a pessary that falls out.  You have vaginal discharge that has a bad smell.  You have low abdominal pain or cramping that is unusual for you. This information is not intended to replace advice given to you by your health care provider. Make sure you discuss any questions you have with your health care provider. Document Released: 10/26/2013 Document Revised: 09/06/2015 Document Reviewed: 06/13/2013 Elsevier Interactive Patient Education  [employer].

## 2017-03-03 ENCOUNTER — Encounter: Payer: Self-pay | Admitting: Obstetrics & Gynecology

## 2017-03-03 ENCOUNTER — Ambulatory Visit (INDEPENDENT_AMBULATORY_CARE_PROVIDER_SITE_OTHER): Payer: Medicare Other | Admitting: Obstetrics & Gynecology

## 2017-03-03 VITALS — BP 130/84

## 2017-03-03 DIAGNOSIS — Z4689 Encounter for fitting and adjustment of other specified devices: Secondary | ICD-10-CM

## 2017-03-03 NOTE — Progress Notes (Signed)
    Kathryn Davis 1951/01/02 [id number]        66 y.o.  [id number]   RP: Insertion of Milex pessary #4 Ring with support  HPI:  As noted at last visit on 01/29/2017:  Was doing well not using the pessary since 2014, but recently felt a bulge at vagina which was uncomfortable.  Tried to insert her Milex #5 ring with support, but it was not staying in.  It was falling out with minimal pressure.  At that visit, pessary fitting was done and the Milex pessary #4 Ring with support was the best fit.  We ordered and received the pessary which will be inserted today.  Past medical history,surgical history, problem list, medications, allergies, family history and social history were all reviewed and documented in the EPIC chart.  Directed ROS with pertinent positives and negatives documented in the history of present illness/assessment and plan.  Exam:  Vitals:   03/03/17 1418  BP: 130/84   General appearance:  Normal  Gyn exam:  Vulva normal.  Vaginal mucosa normal with normal secretions.  Easy insertion of Milex Ring #4 with support.  Stayed in place after squatting and moving around.   Assessment/Plan:  65 y.o. G2P2002  1. Fitting and adjustment of pessary Insertion of Milex Pessary Ring #4 with support.  Good fit.  F/U in 3 months for maintenance.  [name] MD, 2:45 PM 03/03/2017

## 2017-03-07 ENCOUNTER — Encounter: Payer: Self-pay | Admitting: Obstetrics & Gynecology

## 2017-03-07 NOTE — Patient Instructions (Signed)
1. Fitting and adjustment of pessary Insertion of Milex Pessary Ring #4 with support.  Good fit.  F/U in 3 months for maintenance.

## 2017-06-02 ENCOUNTER — Ambulatory Visit: Payer: Medicare Other | Admitting: Obstetrics & Gynecology

## 2017-06-02 ENCOUNTER — Encounter: Payer: Self-pay | Admitting: Obstetrics & Gynecology

## 2017-06-02 VITALS — BP 130/80

## 2017-06-02 DIAGNOSIS — Z4689 Encounter for fitting and adjustment of other specified devices: Secondary | ICD-10-CM | POA: Diagnosis not present

## 2017-06-02 NOTE — Progress Notes (Signed)
    Kathryn Davis 06/21/1950 [id number]        67 y.o.  G2P2002   RP: Pessary check  HPI: Doing very well with Milex ring #4 with support.  No pelvic or vaginal pain.  No vaginal discharge.  No vaginal bleeding.  No SUI.     OB History  Gravida Para Term Preterm AB Living  2 2 2     2   SAB TAB Ectopic Multiple Live Births               # Outcome Date GA Lbr Len/2nd Weight Sex Delivery Anes PTL Lv  2 Term           1 Term               Past medical history,surgical history, problem list, medications, allergies, family history and social history were all reviewed and documented in the EPIC chart.   Directed ROS with pertinent positives and negatives documented in the history of present illness/assessment and plan.  Exam:  Vitals:   06/02/17 1351  BP: 130/80   General appearance:  Normal  Gynecologic exam: Vulva normal.  Pessary removed easily and cleaned.  No vaginal ulceration.  Pessary reinserted.  Patient tried to remove it and reinsert it herself, but not successful.   Assessment/Plan:  67 y.o. G2P2002   1. Encounter for pessary maintenance Doing well very well with pessary, Milex ring with support #4.  Pessary is not causing any inflammation or ulceration in the vagina.  Patient is not able to remove it and clean it herself at this time.  She will follow-up in 4 months for pessary maintenance.  Counseling on above issues more than 50% for 15 minutes.  [name] MD, 2:44 PM 06/02/2017

## 2017-06-04 ENCOUNTER — Encounter: Payer: Self-pay | Admitting: Obstetrics & Gynecology

## 2017-06-04 NOTE — Patient Instructions (Signed)
1. Encounter for pessary maintenance Doing well very well with pessary, Milex ring with support #4.  Pessary is not causing any inflammation or ulceration in the vagina.  Patient is not able to remove it and clean it herself at this time.  She will follow-up in 4 months for pessary maintenance.  Opal Sidles, good seeing you today!

## 2017-06-12 ENCOUNTER — Other Ambulatory Visit: Payer: Self-pay | Admitting: Family Medicine

## 2017-06-12 DIAGNOSIS — Z1231 Encounter for screening mammogram for malignant neoplasm of breast: Secondary | ICD-10-CM

## 2017-06-24 ENCOUNTER — Encounter: Payer: Self-pay | Admitting: Allergy

## 2017-06-24 ENCOUNTER — Ambulatory Visit: Payer: Medicare Other | Admitting: Allergy

## 2017-06-24 VITALS — BP 112/80 | HR 80 | Temp 98.9°F | Resp 16 | Ht 63.5 in | Wt 176.8 lb

## 2017-06-24 DIAGNOSIS — J4520 Mild intermittent asthma, uncomplicated: Secondary | ICD-10-CM

## 2017-06-24 DIAGNOSIS — Z91013 Allergy to seafood: Secondary | ICD-10-CM | POA: Diagnosis not present

## 2017-06-24 DIAGNOSIS — H1010 Acute atopic conjunctivitis, unspecified eye: Secondary | ICD-10-CM | POA: Diagnosis not present

## 2017-06-24 DIAGNOSIS — J309 Allergic rhinitis, unspecified: Secondary | ICD-10-CM

## 2017-06-24 MED ORDER — EPINEPHRINE 0.3 MG/0.3ML IJ SOAJ: 0.3000 mg | Freq: Once | INTRAMUSCULAR | 1 refills | Status: AC

## 2017-06-24 MED ORDER — ALBUTEROL SULFATE HFA 108 (90 BASE) MCG/ACT IN AERS: 2.0000 | INHALATION_SPRAY | Freq: Four times a day (QID) | RESPIRATORY_TRACT | 1 refills | Status: DC | PRN

## 2017-06-24 MED ORDER — BECLOMETHASONE DIPROP HFA 80 MCG/ACT IN AERB: 2.0000 | INHALATION_SPRAY | Freq: Two times a day (BID) | RESPIRATORY_TRACT | 5 refills | Status: DC

## 2017-06-24 MED ORDER — AZELASTINE HCL 0.1 % NA SOLN: 1.0000 | Freq: Two times a day (BID) | NASAL | 5 refills | Status: DC

## 2017-06-24 NOTE — Progress Notes (Signed)
New Patient Note  RE: [name] MRN: [id number] DOB: [birth year] Date of Office Visit: 06/24/2017  Referring provider: Leighton Ruff, MD Primary care provider: Leighton Ruff, MD  Chief Complaint: Allergies  History of present illness: Kathryn Davis is a 67 y.o. female presenting today for consultation for allergy evaluation.  She is a former patient of Dr. Annamaria Boots who was on allergen immunotherapy.  She states she has been taking allergy shots since she was around 67 years old with some breaks in between.    She states she has not Dr. Annamaria Boots since 2014 when she was changed to as needed follow-up as her symptoms were well controlled.  She states shortly after she lost her job including her insurance and was not able to continue on the allergy shots.  She states she was given them to herself at home without an issue.  She states she definitely recalls being mold allergic.  She states when she was on allergy shots she rarely had any illnesses or respiratory symptoms.  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  She states she recently developed cough that was productive as well as increased sinus symptoms and she required azithromycin as well as another antibiotic to clear her symptoms.  She was also on prednisone.  She states she is just been off of these medications for the past couple of days.  She does have an albuterol inhaler as she has a history of allergic asthma.  She also was recently prescribed an Atrovent inhaler due to her respiratory illness.  She did not feel that her pro-air was very effective this time in relieving her respiratory symptoms.  She feels that the combination of being exposed to viral illnesses from her students as well as the moldy environment led to her recent illness.  She would like to resume allergen immunotherapy as she reports she never was sick while on shots.  Her last allergy  testing was done in 1997 she believes.    With her allergic asthma she recalls having a daily maintenance inhaler and a rescue inhaler.  She is not sure what the maintenance inhaler was as she believes she is only had albuterol and Serevent in the past.  She denies any asthma symptoms outside of respiratory illnesses.   She states she is "not allowed to take antihistamines" as she was told by her father who was a physician that they would "dry you out" that she has never tried any antihistamines.  She denies any history of eczema.  She does report an allergy to shellfish as she recalls having vomiting to clams and or scallops.  She states she tolerates shrimp without a problem as well as fish.  She states she is never had an epinephrine device.   Review of systems: Review of Systems  Constitutional: Negative for chills, fever and malaise/fatigue.  HENT: Positive for congestion, sinus pain and sore throat. Negative for ear discharge, ear pain and nosebleeds.   Eyes: Negative for pain, discharge and redness.  Respiratory: Positive for cough, shortness of breath and wheezing.   Cardiovascular: Negative for chest pain.  Gastrointestinal: Negative for abdominal pain, constipation, diarrhea, nausea and vomiting.  Musculoskeletal: Negative for joint pain.  Skin: Negative for itching and rash.  Neurological: Negative for headaches.    All other systems negative unless noted above in HPI  Past medical history: Past Medical History:  Diagnosis Date  .  Asthma   . Cystocele   . Uterine prolapse   . Vaginal pessary present     Past surgical history: Past Surgical History:  Procedure Laterality Date  . BACK SURGERY  2005   HERNIATED DISC  . KNEE ARTHROSCOPY  2011    Family history:  Family History  Problem Relation Age of Onset  . Diabetes Mother   . Osteoporosis Mother   . Allergic rhinitis Mother   . Asthma Mother   . Breast cancer Sister        Age 49  . Allergic rhinitis  Sister   . Asthma Sister   . Breast cancer Maternal Aunt        Age 82  . Allergic rhinitis Father   . Asthma Father   . Allergic rhinitis Brother   . Asthma Brother   . Angioedema Neg Hx   . Eczema Neg Hx     Social history: She lives in a home with carpeting with electric heating and central cooling.  There are 2 dogs in the home.  She does not believe she has an issue with water damage and mildew in her home.  There is no concern for roaches.  She is a Art therapist.  She has no smoking history.   Medication List: Allergies as of 06/24/2017      Reactions   Shellfish Allergy Nausea And Vomiting      Medication List        Accurate as of 06/24/17  6:26 PM. Always use your most recent med list.          albuterol 108 (90 Base) MCG/ACT inhaler Commonly known as:  PROVENTIL HFA;VENTOLIN HFA Inhale 2 puffs into the lungs every 4 (four) hours as needed. Asthma   albuterol 108 (90 Base) MCG/ACT inhaler Commonly known as:  PROVENTIL HFA;VENTOLIN HFA Inhale 2 puffs into the lungs every 6 (six) hours as needed for wheezing or shortness of breath.   atorvastatin 10 MG tablet Commonly known as:  LIPITOR Take 10 mg by mouth daily.   ATROVENT HFA 17 MCG/ACT inhaler Generic drug:  ipratropium INHALE 2 PUFSF BY MOUTH 4 TIMES A DAY AS NEEDED   azelastine 0.1 % nasal spray Commonly known as:  ASTELIN Place 1 spray into both nostrils 2 (two) times daily. Use in each nostril as directed   beclomethasone 80 MCG/ACT inhaler Commonly known as:  QVAR REDIHALER Inhale 2 puffs into the lungs 2 (two) times daily.   EPINEPHrine 0.3 mg/0.3 mL Soaj injection Commonly known as:  EPI-PEN Inject 0.3 mLs (0.3 mg total) into the muscle once for 1 dose.   MULTIVITAMIN PO Take 1 tablet by mouth daily.   salmeterol 50 MCG/DOSE diskus inhaler Commonly known as:  SEREVENT Inhale 1 puff into the lungs 2 (two) times daily.       Known medication allergies: Allergies  Allergen Reactions    . Shellfish Allergy Nausea And Vomiting     Physical examination: Blood pressure 112/80, pulse 80, temperature 98.9 F (37.2 C), temperature source Oral, resp. rate 16, height 5' 3.5" (1.613 m), weight 176 lb 12.8 oz (80.2 kg).  General: Alert, interactive, in no acute distress. HEENT: PERRLA, TMs pearly gray, turbinates mildly edematous with clear discharge, post-pharynx non erythematous. Neck: Supple without lymphadenopathy. Lungs: Very faint expiratory wheeze in the right lower lobe otherwise clear throughout. {no increased work of breathing. CV: Normal S1, S2 without murmurs. Abdomen: Nondistended, nontender. Skin: Warm and dry, without lesions or rashes. Extremities:  No clubbing, cyanosis or edema. Neuro:   Grossly intact.  Diagnositics/Labs:  Spirometry: FEV1: 1.74L  72%, FVC: 2.14L  67%.  This is a total of 2 attempts.  She did not meet ATS criteria.  Allergy testing: Environmental allergy skin prick testing is positive to [place] grass, burweed [employer], [place], box elder, molds, dust mites and cat Allergy testing results were read and interpreted by provider, documented by clinical staff.   Assessment and plan:   Allergic rhinoconjunctivitis    -Environmental testing is positive to grass, weed, tree, molds, dust mites and cat    -Allergen avoidance measures discussed and provided    -Allergen immunotherapy (allergy shots) discussed today and she would like to restart this therapy.  We will order new vials in preparation to start in the next several weeks.  Allergen immunotherapy informational packet provided including insurance codes for coverage.  Will prescribe an epinephrine device (EpiPen) to bring on the days of her allergy shots.    -For nasal drainage recommend using Astelin nasal spray 2 sprays each nostril 1-2 times a day    -A daily antihistamine would be helpful in controlling allergy symptoms like Allegra or Zyrtec.  Antihistamines may cause dryness however  this is not typically a significant effect of the medications.    Allergic asthma   -Allergen exposure as above may trigger asthma symptoms.  It is very likely that she has a significant mold exposure at her workplace.  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   -have access to albuterol inhaler 2 puffs every 4-6 hours as needed for cough/wheeze/shortness of breath/chest tightness.  May use 15-20 minutes prior to activity.   Monitor frequency of use.     -We will prescribe Qvar redihaler 2 puffs twice a day to use during respiratory illnesses.  Start at the first sign of respiratory illness or symptoms including cough, wheezing, chest tightness or shortness of breath.  Use for 1-2 weeks at a time or until symptoms have improved.  Shellfish allergy   -Continue avoidance of mollusks (clams, scallops, oysters)   -Have access to EpiPen as above in case of allergic reaction   -Follow emergency action plan in case of allergic reaction  Follow-up 4-6 months or sooner if needed  I appreciate the opportunity to take part in [place] care. Please do not hesitate to contact me with questions.  Sincerely,   Prudy Feeler, MD Allergy/Immunology [facility]

## 2017-06-24 NOTE — Patient Instructions (Signed)
Allergic rhinoconjunctivitis    -Environmental testing is positive to grass, weed, tree, molds, dust mites and cat    -Allergen avoidance measures discussed and provided    -Allergen immunotherapy (allergy shots) discussed today and she would like to restart this therapy.  We will order new vials in preparation to start in the next several weeks.  Allergen immunotherapy informational packet provided including insurance codes for coverage.  Will prescribe an epinephrine device (EpiPen) to bring on the days of her allergy shots.    -For nasal drainage recommend using Astelin nasal spray 2 sprays each nostril 1-2 times a day    -A daily antihistamine would be helpful in controlling allergy symptoms like Allegra or Zyrtec.  Antihistamines may cause dryness however this is not typically a significant effect of the medications.    Allergic asthma   -Allergen exposure as above may trigger asthma symptoms.  It is very likely that she has a significant mold exposure at her workplace.  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   -have access to albuterol inhaler 2 puffs every 4-6 hours as needed for cough/wheeze/shortness of breath/chest tightness.  May use 15-20 minutes prior to activity.   Monitor frequency of use.     -We will prescribe Qvar redihaler 2 puffs twice a day to use during respiratory illnesses.  Start at the first sign of respiratory illness or symptoms including cough, wheezing, chest tightness or shortness of breath.  Use for 1-2 weeks at a time or until symptoms have improved.  Shellfish allergy   -Continue avoidance of mollusks (clams, scallops, oysters)   -Have access to EpiPen as above in case of allergic reaction   -Follow emergency action plan in case of allergic reaction  Follow-up 4-6 months or sooner if needed

## 2017-07-02 ENCOUNTER — Ambulatory Visit
Admission: RE | Admit: 2017-07-02 | Discharge: 2017-07-02 | Disposition: A | Discharge status: Home / Self Care | Payer: Medicare Other | Source: Ambulatory Visit | Attending: Family Medicine | Admitting: Family Medicine

## 2017-07-02 DIAGNOSIS — J3089 Other allergic rhinitis: Secondary | ICD-10-CM | POA: Diagnosis not present

## 2017-07-02 DIAGNOSIS — Z1231 Encounter for screening mammogram for malignant neoplasm of breast: Secondary | ICD-10-CM

## 2017-07-02 NOTE — Addendum Note (Signed)
Addended by: Theresia Lo on: 07/02/2017 08:45 AM   Modules accepted: Orders

## 2017-07-02 NOTE — Progress Notes (Signed)
VIALS EXP 07-03-18

## 2017-07-03 DIAGNOSIS — J301 Allergic rhinitis due to pollen: Secondary | ICD-10-CM | POA: Diagnosis not present

## 2017-07-08 ENCOUNTER — Ambulatory Visit: Payer: Medicare Other | Admitting: Allergy and Immunology

## 2017-07-15 ENCOUNTER — Ambulatory Visit (INDEPENDENT_AMBULATORY_CARE_PROVIDER_SITE_OTHER): Payer: Medicare Other | Admitting: *Deleted

## 2017-07-15 DIAGNOSIS — J309 Allergic rhinitis, unspecified: Secondary | ICD-10-CM | POA: Diagnosis not present

## 2017-07-15 MED ORDER — EPINEPHRINE 0.3 MG/0.3ML IJ SOAJ: INTRAMUSCULAR | 1 refills | Status: DC

## 2017-07-15 NOTE — Progress Notes (Signed)
Immunotherapy   Patient Details  Name: Kathryn Davis MRN: [id number] Date of Birth: [birth year]  07/15/2017  Arlan Organ started injections for  Mold-Mite/Pollen-Pet Following schedule: B  Frequency:2 times per week Epi-Pen:Prescription for Epi-Pen given Consent signed and patient instructions given. Patient waited 30 min after injections without difficulties   Orlene Erm 07/15/2017, 4:43 PM

## 2017-07-22 ENCOUNTER — Ambulatory Visit (INDEPENDENT_AMBULATORY_CARE_PROVIDER_SITE_OTHER): Payer: Medicare Other | Admitting: *Deleted

## 2017-07-22 DIAGNOSIS — J309 Allergic rhinitis, unspecified: Secondary | ICD-10-CM | POA: Diagnosis not present

## 2017-07-29 ENCOUNTER — Ambulatory Visit (INDEPENDENT_AMBULATORY_CARE_PROVIDER_SITE_OTHER): Payer: Medicare Other | Admitting: *Deleted

## 2017-07-29 DIAGNOSIS — J309 Allergic rhinitis, unspecified: Secondary | ICD-10-CM | POA: Diagnosis not present

## 2017-08-06 ENCOUNTER — Ambulatory Visit (INDEPENDENT_AMBULATORY_CARE_PROVIDER_SITE_OTHER): Payer: Medicare Other | Admitting: *Deleted

## 2017-08-06 DIAGNOSIS — J309 Allergic rhinitis, unspecified: Secondary | ICD-10-CM

## 2017-08-12 ENCOUNTER — Ambulatory Visit (INDEPENDENT_AMBULATORY_CARE_PROVIDER_SITE_OTHER): Payer: Medicare Other

## 2017-08-12 DIAGNOSIS — J309 Allergic rhinitis, unspecified: Secondary | ICD-10-CM | POA: Diagnosis not present

## 2017-08-20 ENCOUNTER — Ambulatory Visit (INDEPENDENT_AMBULATORY_CARE_PROVIDER_SITE_OTHER): Payer: Medicare Other | Admitting: *Deleted

## 2017-08-20 DIAGNOSIS — J309 Allergic rhinitis, unspecified: Secondary | ICD-10-CM | POA: Diagnosis not present

## 2017-08-31 ENCOUNTER — Telehealth: Payer: Self-pay

## 2017-08-31 NOTE — Telephone Encounter (Signed)
Her main inhaler should be Qvar she can take 2-3 puffs 3 times a during right now with flare in symptoms.   Albuterol continue as needed use.   She can also try use of Mucinex or Mucinex DM 1200mg  1-2 times a day with plenty of water to help further loosen mucus.    For any nasal drainage recommended using Astelin nasal spray 2 sprays each nostril 1-2 times a day  And ensure she is taking a daily antihistamine at this time like Allegra or Zyrtec.  Antihistamines may cause dryness however this is not typically a significant effect of the medications.  It is likely that she may need oral steroids.

## 2017-08-31 NOTE — Telephone Encounter (Signed)
Dr. Padgett? 

## 2017-08-31 NOTE — Telephone Encounter (Signed)
Patient has had a bad cough for about a week now. She is using her main inhaler and rescue inhalers. After 4 hours of her rescue inhaler she starts back coughing. She is also trying Flonase. When she does a hot shower it seems to loosen some of the congestion that's in her chest. She is having a hard time sleeping, unable to lay down flat. She had an allergy shot  On 08/20/17 and she states she had a reaction with a raised bump that was itchy. She skipped her last injection because she is sick.  She is down to see Dr Nelva Bush on Wednesday just incase she needs to be seen.    [facility]

## 2017-09-01 NOTE — Telephone Encounter (Signed)
Spoke to patient advised of plan. Patient verbalized understanding. She states she does have an appt for 09/02/17 @ 2:30p advised patient to keep appt for now if doing better she can call and cancel in the morning but if the same or worse come in.

## 2017-09-02 ENCOUNTER — Ambulatory Visit: Payer: Medicare Other | Admitting: Allergy

## 2017-09-02 ENCOUNTER — Ambulatory Visit
Admission: RE | Admit: 2017-09-02 | Discharge: 2017-09-02 | Disposition: A | Discharge status: Home / Self Care | Payer: Medicare Other | Source: Ambulatory Visit | Attending: Allergy | Admitting: Allergy

## 2017-09-02 ENCOUNTER — Encounter: Payer: Self-pay | Admitting: Allergy

## 2017-09-02 VITALS — BP 138/80 | HR 82 | Temp 97.8°F | Resp 18

## 2017-09-02 DIAGNOSIS — J4541 Moderate persistent asthma with (acute) exacerbation: Secondary | ICD-10-CM | POA: Diagnosis not present

## 2017-09-02 NOTE — Patient Instructions (Addendum)
Allergic rhinoconjunctivitis    -continue avoidance measures to grass, weed, tree, molds, dust mites and cat    -Continue allergen immunotherapy per schedule and have access to epinephrine device (EpiPen) to carry on the days of her allergy shots.    -For nasal drainage recommend using Astelin nasal spray 2 sprays each nostril 2 times a day    - continue daily Allegra 180mg   Allergic asthma   - current exacerbation with allergen exposures   -Allergen exposure as above may trigger asthma symptoms.   -have access to albuterol inhaler 2 puffs every 4-6 hours as needed for cough/wheeze/shortness of breath/chest tightness.  May use 15-20 minutes prior to activity.   Monitor frequency of use.     -Use Qvar redihaler 2 puffs twice a day.   During respiratory illnesses/flares may use Qvar 3 puffs 3 times a day for 1-2 weeks and go back to regular dosing once symptoms improve.      - complete prednisone course per your PCP  - will obtain CXR due to continued/persistent cough  Possible sleep apnea   - will order split night sleep study.  Recommend performing this once you are well  Shellfish allergy   -Continue avoidance of mollusks (clams, scallops, oysters)   -Have access to EpiPen as above in case of allergic reaction   -Follow emergency action plan in case of allergic reaction  Follow-up 4-6 months or sooner if needed

## 2017-09-02 NOTE — Progress Notes (Signed)
Follow-up Note  RE: LILA LUFKIN MRN: [id number] DOB: 11-Jan-1951 Date of Office Visit: 09/02/2017   History of present illness: Kathryn Davis is a 67 y.o. female presenting today for sick visit. She was last seen in the office on 06/24/17 for initial evaluation by myself.  She states about a week or so ago she developed a dry hacking cough but states would sometimes produce clear sputum.  She is concerned about this as she has had PNA in the past that she got really sick from.  She denies having a fevers.  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  She states she was using her inhalers backwards as in was using the Qvar as needed and taking the albuterol scheduled.  She called in the office with these symptoms and the inhalers use was corrected and she since has been using Qvar daily with as needed use of albuterol.  She states since properly uses the Qvar she has noted improvement in her cough but has not eliminated symptoms.  She also saw her PCP on Monday due to these symptoms who prescribed a course of prednisone that she is still taking.  She also is concerned with these symptoms as she states she wlil wake up in a panic where she feels SOB and is also concerned for sleep apnea as she snores. She does report waking up every morning with a HA that subsides after she showers as well as states she is tired.     She is on allergen immunotherapy and is taking allegra.  She states she just picked up the azelastine and plans to start today.    Review of systems: Review of Systems  Constitutional: Positive for malaise/fatigue. Negative for chills and fever.  HENT: Positive for congestion. Negative for ear discharge, ear pain, nosebleeds, sinus pain and sore throat.   Eyes: Negative for pain, discharge and redness.  Respiratory: Positive for cough, sputum production and shortness of breath.   Cardiovascular:  Negative for chest pain.  Gastrointestinal: Negative for abdominal pain, constipation, diarrhea, heartburn, nausea and vomiting.  Musculoskeletal: Negative for joint pain.  Skin: Negative for itching and rash.  Neurological: Negative for headaches.    All other systems negative unless noted above in HPI  Past medical/social/surgical/family history have been reviewed and are unchanged unless specifically indicated below.  No changes  Medication List: Allergies as of 09/02/2017      Reactions   Shellfish Allergy Nausea And Vomiting      Medication List        Accurate as of 09/02/17  5:04 PM. Always use your most recent med list.          albuterol 108 (90 Base) MCG/ACT inhaler Commonly known as:  PROVENTIL HFA;VENTOLIN HFA Inhale 2 puffs into the lungs every 4 (four) hours as needed. Asthma   albuterol 108 (90 Base) MCG/ACT inhaler Commonly known as:  PROVENTIL HFA;VENTOLIN HFA Inhale 2 puffs into the lungs every 6 (six) hours as needed for wheezing or shortness of breath.   atorvastatin 10 MG tablet Commonly known as:  LIPITOR Take 10 mg by mouth daily.   azelastine 0.1 % nasal spray Commonly known as:  ASTELIN Place 1 spray into both nostrils 2 (two) times daily. Use in each nostril as directed   beclomethasone 80 MCG/ACT inhaler Commonly known as:  QVAR REDIHALER Inhale 2 puffs into the lungs 2 (  two) times daily.   EPINEPHrine 0.3 mg/0.3 mL Soaj injection Commonly known as:  EPIPEN 2-PAK Use as directed for severe allergic reaction   MULTIVITAMIN PO Take 1 tablet by mouth daily.   salmeterol 50 MCG/DOSE diskus inhaler Commonly known as:  SEREVENT Inhale 1 puff into the lungs 2 (two) times daily.       Known medication allergies: Allergies  Allergen Reactions  . Shellfish Allergy Nausea And Vomiting     Physical examination: Blood pressure 138/80, pulse 82, temperature 97.8 F (36.6 C), temperature source Oral, resp. rate 18, SpO2 98 %.  General:  Alert, interactive, in no acute distress. HEENT: PERRLA, TMs pearly gray, turbinates moderately edematous with clear discharge, post-pharynx non erythematous. Neck: Supple without lymphadenopathy. Lungs: Clear to auscultation without wheezing, rhonchi or rales. {no increased work of breathing. CV: Normal S1, S2 without murmurs. Abdomen: Nondistended, nontender. Skin: Warm and dry, without lesions or rashes. Extremities:  No clubbing, cyanosis or edema. Neuro:   Grossly intact.  Diagnositics/Labs: None today  Assessment and plan:   Allergic rhinoconjunctivitis    -continue avoidance measures to grass, weed, tree, molds, dust mites and cat    -Continue allergen immunotherapy per schedule and have access to epinephrine device (EpiPen) to carry on the days of her allergy shots.    -For nasal drainage recommend using Astelin nasal spray 2 sprays each nostril 2 times a day    - continue daily Allegra 180mg   Allergic asthma   - current exacerbation with allergen exposures   -Allergen exposure as above may trigger asthma symptoms.   -have access to albuterol inhaler 2 puffs every 4-6 hours as needed for cough/wheeze/shortness of breath/chest tightness.  May use 15-20 minutes prior to activity.   Monitor frequency of use.     -Use Qvar redihaler 2 puffs twice a day.   During respiratory illnesses/flares may use Qvar 3 puffs 3 times a day for 1-2 weeks and go back to regular dosing once symptoms improve.      - complete prednisone course per your PCP  - will obtain CXR due to continued/persistent cough.   also pt is very anxious/worried about persistence of cough with her history of previous PNA  Possible sleep apnea   - with snoring, morning HA and generalized tiredness recommend sleep study to assess for OSA.    - will order split night sleep study.  Recommend performing this once you are well  Shellfish allergy   -Continue avoidance of mollusks (clams, scallops, oysters)   -Have access to  EpiPen as above in case of allergic reaction   -Follow emergency action plan in case of allergic reaction  Follow-up 4-6 months or sooner if needed  I appreciate the opportunity to take part in [place] care. Please do not hesitate to contact me with questions.  Sincerely,   Prudy Feeler, MD Allergy/Immunology [facility]

## 2017-09-03 NOTE — Addendum Note (Signed)
Addended by: Lucrezia Starch I on: 09/03/2017 09:11 AM   Modules accepted: Orders

## 2017-09-14 ENCOUNTER — Ambulatory Visit (INDEPENDENT_AMBULATORY_CARE_PROVIDER_SITE_OTHER): Payer: Medicare Other

## 2017-09-14 DIAGNOSIS — J309 Allergic rhinitis, unspecified: Secondary | ICD-10-CM | POA: Diagnosis not present

## 2017-09-17 ENCOUNTER — Telehealth: Payer: Self-pay

## 2017-09-17 NOTE — Telephone Encounter (Signed)
Sleep department left the patient a message on 09/11/2017. Patient has not returned call.

## 2017-09-17 NOTE — Telephone Encounter (Signed)
-----   Message from Fernand Parkins sent at 09/02/2017  4:26 PM EDT ----- Regarding: FW: Split Night Sleep Study   ----- Message ----- From: Lucrezia Starch I, CMA Sent: 09/02/2017   3:47 PM To: Fernand Parkins Subject: Split Night Sleep Study                        Dr. Nelva Bush would like for Opal Sidles to have a split night sleep study completed. Please set up referral and appointment.    Thank ya.

## 2017-09-24 ENCOUNTER — Ambulatory Visit (INDEPENDENT_AMBULATORY_CARE_PROVIDER_SITE_OTHER): Payer: Medicare Other

## 2017-09-24 DIAGNOSIS — J309 Allergic rhinitis, unspecified: Secondary | ICD-10-CM

## 2017-09-24 NOTE — Progress Notes (Signed)
Patient stated that she had asthma flare up for 5 days after last injection and she wanted to stay at the same dose to see if the shots are causing her asthma to flare.

## 2017-09-30 ENCOUNTER — Ambulatory Visit: Payer: Medicare Other | Admitting: Obstetrics & Gynecology

## 2017-09-30 ENCOUNTER — Encounter: Payer: Self-pay | Admitting: Obstetrics & Gynecology

## 2017-09-30 VITALS — BP 136/82

## 2017-09-30 DIAGNOSIS — Z4689 Encounter for fitting and adjustment of other specified devices: Secondary | ICD-10-CM | POA: Diagnosis not present

## 2017-09-30 NOTE — Patient Instructions (Signed)
1. Encounter for pessary maintenance Very well with pessary Milex ring #4 with support.  Good fit with no vaginal irritation, no infection and good control of the prolapse symptoms. Will follow-up for annual gynecologic exam and pessary maintenance in 4 months.  [place], it was a pleasure seeing you today!

## 2017-09-30 NOTE — Progress Notes (Signed)
    Kathryn Davis July 31, 1950 [id number]        67 y.o.  G2P2002 single.  RP: Pessary maintenance  HPI: Well with Milex ring #4 with support.  Pessary stays in place with no vaginal pain.  No abnormal vaginal discharge.  No vaginal bleeding.  No stress urinary incontinence.  No urinary tract infection symptoms.  No fever.  Abstinent.   OB History  Gravida Para Term Preterm AB Living  2 2 2     2   SAB TAB Ectopic Multiple Live Births               # Outcome Date GA Lbr Len/2nd Weight Sex Delivery Anes PTL Lv  2 Term           1 Term             Past medical history,surgical history, problem list, medications, allergies, family history and social history were all reviewed and documented in the EPIC chart.   Directed ROS with pertinent positives and negatives documented in the history of present illness/assessment and plan.  Exam:  Vitals:   09/30/17 1416  BP: 136/82   General appearance:  Normal  Gynecologic exam: Vulva normal.  Vaginal exam: Milex pessary removed easily.  Mild discharge on the past 3.  Pessary thoroughly cleaned with antibacterial soap and rinsed.  Vaginal mucosa intact.  Pessary put back in place easily.   Assessment/Plan:  67 y.o. G2P2002   1. Encounter for pessary maintenance Very well with pessary Milex ring #4 with support.  Good fit with no vaginal irritation, no infection and good control of the prolapse symptoms. Will follow-up for annual gynecologic exam and pessary maintenance in 4 months.  Counseling on above issues and coordination of care more than 50% for 15 minutes.  [name] MD, 2:17 PM 09/30/2017

## 2017-10-01 ENCOUNTER — Ambulatory Visit (INDEPENDENT_AMBULATORY_CARE_PROVIDER_SITE_OTHER): Payer: Medicare Other | Admitting: *Deleted

## 2017-10-01 DIAGNOSIS — J309 Allergic rhinitis, unspecified: Secondary | ICD-10-CM

## 2017-10-12 ENCOUNTER — Ambulatory Visit (INDEPENDENT_AMBULATORY_CARE_PROVIDER_SITE_OTHER): Payer: Medicare Other | Admitting: *Deleted

## 2017-10-12 DIAGNOSIS — J309 Allergic rhinitis, unspecified: Secondary | ICD-10-CM

## 2017-10-19 ENCOUNTER — Ambulatory Visit (HOSPITAL_BASED_OUTPATIENT_CLINIC_OR_DEPARTMENT_OTHER): Payer: Medicare Other | Attending: Allergy | Admitting: Internal Medicine

## 2017-10-19 DIAGNOSIS — Z79899 Other long term (current) drug therapy: Secondary | ICD-10-CM | POA: Insufficient documentation

## 2017-10-19 DIAGNOSIS — G478 Other sleep disorders: Secondary | ICD-10-CM | POA: Insufficient documentation

## 2017-10-19 DIAGNOSIS — G4761 Periodic limb movement disorder: Secondary | ICD-10-CM | POA: Diagnosis not present

## 2017-10-19 DIAGNOSIS — J4541 Moderate persistent asthma with (acute) exacerbation: Secondary | ICD-10-CM | POA: Diagnosis present

## 2017-10-21 ENCOUNTER — Ambulatory Visit (INDEPENDENT_AMBULATORY_CARE_PROVIDER_SITE_OTHER): Payer: Medicare Other | Admitting: *Deleted

## 2017-10-21 DIAGNOSIS — J309 Allergic rhinitis, unspecified: Secondary | ICD-10-CM

## 2017-10-24 DIAGNOSIS — G478 Other sleep disorders: Secondary | ICD-10-CM

## 2017-10-24 NOTE — Procedures (Signed)
  Patient Name: Kathryn Davis, Kathryn Davis Date: 10/19/2017 Gender: Female D.O.B: 12-17-1950 Age (years): 67 Referring Provider: Prudy Feeler Height (inches): 62 Interpreting Physician: Baird Lyons MD, ABSM Weight (lbs): 175 RPSGT: Zadie Rhine BMI: 32 MRN: [id number] Neck Size: 15.50  CLINICAL INFORMATION Sleep Study Type: NPSG  Indication for sleep study: OSA, fatigue  Epworth Sleepiness Score: 14  SLEEP STUDY TECHNIQUE As per the AASM Manual for the Scoring of Sleep and Associated Events v2.3 (April 2016) with a hypopnea requiring 4% desaturations.  The channels recorded and monitored were frontal, central and occipital EEG, electrooculogram (EOG), submentalis EMG (chin), nasal and oral airflow, thoracic and abdominal wall motion, anterior tibialis EMG, snore microphone, electrocardiogram, and pulse oximetry.  MEDICATIONS Medications self-administered by patient taken the night of the study : ATORVASTATIN  SLEEP ARCHITECTURE The study was initiated at 10:46:39 PM and ended at 4:41:26 AM.  Sleep onset time was 12.7 minutes and the sleep efficiency was 72.2%%. The total sleep time was 256 minutes.  Stage REM latency was 278.0 minutes.  The patient spent 4.1%% of the night in stage N1 sleep, 67.0%% in stage N2 sleep, 8.0%% in stage N3 and 20.90% in REM.  Alpha intrusion was absent.  Supine sleep was 10.24%.  RESPIRATORY PARAMETERS The overall apnea/hypopnea index (AHI) was 0.2 per hour. There were 0 total apneas, including 0 obstructive, 0 central and 0 mixed apneas. There were 1 hypopneas and 48 RERAs.  The AHI during Stage REM sleep was 0.0 per hour.  AHI while supine was 0.0 per hour.  The mean oxygen saturation was 93.5%. The minimum SpO2 during sleep was 90.0%.  soft snoring was noted during this study.  CARDIAC DATA The 2 lead EKG demonstrated sinus rhythm. The mean heart rate was 60.9 beats per minute. Other EKG findings include: None.  LEG MOVEMENT  DATA The total PLMS were 248 with a resulting PLMS index of 58.1. Associated arousal with leg movement index was 3.0 .  IMPRESSIONS - No significant obstructive sleep apnea occurred during this study (AHI = 0.2/h). - No significant central sleep apnea occurred during this study (CAI = 0.0/h). - The patient had minimal or no oxygen desaturation during the study (Min O2 = 90.0%) - The patient snored with soft snoring volume. - No cardiac abnormalities were noted during this study. - Periodic limb movements were frequent in the last hours of the night, with limited arousals.  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 - Sleep hygiene should be reviewed to assess factors that may improve sleep quality. - Weight management and regular exercise should be initiated or continued if appropriate.  [Electronically signed] 10/24/2017 10:44 AM  Baird Lyons MD, ABSM Diplomate, American Board of Sleep Medicine   NPI: [id number]                           [place]  ELECTRONICALLY SIGNED ON:  10/24/2017, 10:40 AM [place] PH: [phone]   FX: [phone] [place]

## 2017-11-02 ENCOUNTER — Ambulatory Visit (INDEPENDENT_AMBULATORY_CARE_PROVIDER_SITE_OTHER): Payer: Medicare Other | Admitting: *Deleted

## 2017-11-02 DIAGNOSIS — J309 Allergic rhinitis, unspecified: Secondary | ICD-10-CM

## 2017-11-09 ENCOUNTER — Ambulatory Visit (INDEPENDENT_AMBULATORY_CARE_PROVIDER_SITE_OTHER): Payer: Medicare Other

## 2017-11-09 DIAGNOSIS — J309 Allergic rhinitis, unspecified: Secondary | ICD-10-CM

## 2017-11-18 ENCOUNTER — Ambulatory Visit (INDEPENDENT_AMBULATORY_CARE_PROVIDER_SITE_OTHER): Payer: Medicare Other | Admitting: *Deleted

## 2017-11-18 DIAGNOSIS — J309 Allergic rhinitis, unspecified: Secondary | ICD-10-CM | POA: Diagnosis not present

## 2017-11-25 ENCOUNTER — Ambulatory Visit (INDEPENDENT_AMBULATORY_CARE_PROVIDER_SITE_OTHER): Payer: Medicare Other | Admitting: *Deleted

## 2017-11-25 DIAGNOSIS — J309 Allergic rhinitis, unspecified: Secondary | ICD-10-CM | POA: Diagnosis not present

## 2017-12-04 ENCOUNTER — Ambulatory Visit (INDEPENDENT_AMBULATORY_CARE_PROVIDER_SITE_OTHER): Payer: Medicare Other

## 2017-12-04 DIAGNOSIS — J309 Allergic rhinitis, unspecified: Secondary | ICD-10-CM | POA: Diagnosis not present

## 2017-12-09 ENCOUNTER — Ambulatory Visit (INDEPENDENT_AMBULATORY_CARE_PROVIDER_SITE_OTHER): Payer: Medicare Other

## 2017-12-09 DIAGNOSIS — J309 Allergic rhinitis, unspecified: Secondary | ICD-10-CM

## 2017-12-22 ENCOUNTER — Ambulatory Visit (INDEPENDENT_AMBULATORY_CARE_PROVIDER_SITE_OTHER): Payer: Medicare Other | Admitting: *Deleted

## 2017-12-22 DIAGNOSIS — J309 Allergic rhinitis, unspecified: Secondary | ICD-10-CM

## 2017-12-31 ENCOUNTER — Ambulatory Visit (INDEPENDENT_AMBULATORY_CARE_PROVIDER_SITE_OTHER): Payer: Medicare Other | Admitting: *Deleted

## 2017-12-31 DIAGNOSIS — J309 Allergic rhinitis, unspecified: Secondary | ICD-10-CM | POA: Diagnosis not present

## 2018-01-05 ENCOUNTER — Ambulatory Visit (INDEPENDENT_AMBULATORY_CARE_PROVIDER_SITE_OTHER): Payer: Medicare Other | Admitting: *Deleted

## 2018-01-05 DIAGNOSIS — J309 Allergic rhinitis, unspecified: Secondary | ICD-10-CM | POA: Diagnosis not present

## 2018-01-14 ENCOUNTER — Ambulatory Visit (INDEPENDENT_AMBULATORY_CARE_PROVIDER_SITE_OTHER): Payer: Medicare Other | Admitting: *Deleted

## 2018-01-14 DIAGNOSIS — J309 Allergic rhinitis, unspecified: Secondary | ICD-10-CM

## 2018-01-21 ENCOUNTER — Ambulatory Visit (INDEPENDENT_AMBULATORY_CARE_PROVIDER_SITE_OTHER): Payer: Medicare Other | Admitting: *Deleted

## 2018-01-21 DIAGNOSIS — J309 Allergic rhinitis, unspecified: Secondary | ICD-10-CM

## 2018-01-26 ENCOUNTER — Ambulatory Visit (INDEPENDENT_AMBULATORY_CARE_PROVIDER_SITE_OTHER): Payer: Medicare Other | Admitting: *Deleted

## 2018-01-26 ENCOUNTER — Ambulatory Visit: Payer: Medicare Other | Admitting: Obstetrics & Gynecology

## 2018-01-26 DIAGNOSIS — J309 Allergic rhinitis, unspecified: Secondary | ICD-10-CM

## 2018-01-27 ENCOUNTER — Ambulatory Visit: Payer: Medicare Other | Admitting: Obstetrics & Gynecology

## 2018-01-28 ENCOUNTER — Ambulatory Visit: Payer: Medicare Other | Admitting: Obstetrics & Gynecology

## 2018-02-04 ENCOUNTER — Ambulatory Visit (INDEPENDENT_AMBULATORY_CARE_PROVIDER_SITE_OTHER): Payer: Medicare Other

## 2018-02-04 ENCOUNTER — Ambulatory Visit: Payer: Medicare Other | Admitting: Obstetrics & Gynecology

## 2018-02-04 ENCOUNTER — Encounter: Payer: Self-pay | Admitting: Obstetrics & Gynecology

## 2018-02-04 VITALS — BP 132/84 | Ht 64.0 in | Wt 178.0 lb

## 2018-02-04 DIAGNOSIS — J309 Allergic rhinitis, unspecified: Secondary | ICD-10-CM

## 2018-02-04 DIAGNOSIS — Z780 Asymptomatic menopausal state: Secondary | ICD-10-CM

## 2018-02-04 DIAGNOSIS — N898 Other specified noninflammatory disorders of vagina: Secondary | ICD-10-CM | POA: Diagnosis not present

## 2018-02-04 DIAGNOSIS — T8389XA Other specified complication of genitourinary prosthetic devices, implants and grafts, initial encounter: Secondary | ICD-10-CM | POA: Diagnosis not present

## 2018-02-04 DIAGNOSIS — Z124 Encounter for screening for malignant neoplasm of cervix: Secondary | ICD-10-CM | POA: Diagnosis not present

## 2018-02-04 DIAGNOSIS — Z01419 Encounter for gynecological examination (general) (routine) without abnormal findings: Secondary | ICD-10-CM

## 2018-02-04 DIAGNOSIS — Z13820 Encounter for screening for osteoporosis: Secondary | ICD-10-CM | POA: Diagnosis not present

## 2018-02-04 NOTE — Progress Notes (Signed)
Kathryn Davis 26-Feb-1951 [id number]   History:    67 y.o. G2P2L2 Single  RP:  Established patient presenting for annual gyn exam   HPI: Menopause, well on no HRT.  Abstinent x many years.  Well on Milex ring #4 with support.  Mild vaginal discharge but no bleeding and no odor or itching.  No pelvic pain.  No stress urinary incontinence or urgency.  No fever.  Breasts normal.  Body mass index 30.55.  Health labs with family physician.  Past medical history,surgical history, family history and social history were all reviewed and documented in the EPIC chart.  Gynecologic History No LMP recorded. Patient is postmenopausal. Contraception: abstinence and post menopausal status Last Pap: 09/2012. Results were: Negative/HPV HR neg Last mammogram: 06/2017. Results were: Negative Bone Density: 01/2013 normal Colonoscopy: 2014.  On a 10 yr schedule  Obstetric History OB History  Gravida Para Term Preterm AB Living  2 2 2     2   SAB TAB Ectopic Multiple Live Births               # Outcome Date GA Lbr Len/2nd Weight Sex Delivery Anes PTL Lv  2 Term           1 Term              ROS: A ROS was performed and pertinent positives and negatives are included in the history.  GENERAL: No fevers or chills. HEENT: No change in vision, no earache, sore throat or sinus congestion. NECK: No pain or stiffness. CARDIOVASCULAR: No chest pain or pressure. No palpitations. PULMONARY: No shortness of breath, cough or wheeze. GASTROINTESTINAL: No abdominal pain, nausea, vomiting or diarrhea, melena or bright red blood per rectum. GENITOURINARY: No urinary frequency, urgency, hesitancy or dysuria. MUSCULOSKELETAL: No joint or muscle pain, no back pain, no recent trauma. DERMATOLOGIC: No rash, no itching, no lesions. ENDOCRINE: No polyuria, polydipsia, no heat or cold intolerance. No recent change in weight. HEMATOLOGICAL: No anemia or easy bruising or bleeding. NEUROLOGIC: No headache, seizures,  numbness, tingling or weakness. PSYCHIATRIC: No depression, no loss of interest in normal activity or change in sleep pattern.     Exam:   BP 132/84   Ht 5\' 4"  (1.626 m)   Wt 178 lb (80.7 kg)   BMI 30.55 kg/m   Body mass index is 30.55 kg/m.  General appearance : Well developed well nourished female. No acute distress HEENT: Eyes: no retinal hemorrhage or exudates,  Neck supple, trachea midline, no carotid bruits, no thyroidmegaly Lungs: Clear to auscultation, no rhonchi or wheezes, or rib retractions  Heart: Regular rate and rhythm, no murmurs or gallops Breast:Examined in sitting and supine position were symmetrical in appearance, no palpable masses or tenderness,  no skin retraction, no nipple inversion, no nipple discharge, no skin discoloration, no axillary or supraclavicular lymphadenopathy Abdomen: no palpable masses or tenderness, no rebound or guarding Extremities: no edema or skin discoloration or tenderness  Pelvic: Vulva: Normal             Vagina: Upper 1/3 anterior left wall of vagina with ulceration and erythema.  Cervix: No gross lesions or discharge  Uterus  AV, normal size, shape and consistency, non-tender and mobile  Adnexa  Without masses or tenderness  Anus: Normal  Pessary removed and cleaned.  Not reinserted because of the vaginal ulcer/erythema.   Assessment/Plan:  67 y.o. female for annual exam   1. Encounter for routine gynecological examination with Papanicolaou smear  of cervix Gynecologic exam with mild vaginal erosion secondary to pessary.  Pap test negative in 2014 no abnormal Pap test in the past and patient is abstinent.  No indication to repeat Pap test this year.  Breast exam normal.  Screening mammogram in March 2019 was negative.  Health labs with family physician.  2. Postmenopausal Well on no hormone replacement therapy.  No postmenopausal bleeding.  3. Screening for osteoporosis Will schedule bone density here now.  Vitamin D  supplements, calcium intake of 1.5 g/day, regular weightbearing activities recommended. - DG Bone Density; Future  4. Vaginal erosion secondary to pessary use, initial encounter [facility]) Given the vaginal erosion with a small oral ulcer, decision made to take a pessary break until reevaluation in 4 weeks.  Counseling on above issues and coordination of care more than 50% for 10 minutes.  [name] MD, 9:35 AM 02/04/2018

## 2018-02-05 LAB — PAP IG W/ RFLX HPV ASCU

## 2018-02-07 ENCOUNTER — Encounter: Payer: Self-pay | Admitting: Obstetrics & Gynecology

## 2018-02-07 NOTE — Patient Instructions (Signed)
1. Encounter for routine gynecological examination with Papanicolaou smear of cervix Gynecologic exam with mild vaginal erosion secondary to pessary.  Pap test negative in 2014 no abnormal Pap test in the past and patient is abstinent.  No indication to repeat Pap test this year.  Breast exam normal.  Screening mammogram in March 2019 was negative.  Health labs with family physician.  2. Postmenopausal Well on no hormone replacement therapy.  No postmenopausal bleeding.  3. Screening for osteoporosis Will schedule bone density here now.  Vitamin D supplements, calcium intake of 1.5 g/day, regular weightbearing activities recommended. - DG Bone Density; Future  4. Vaginal erosion secondary to pessary use, initial encounter [facility]) Given the vaginal erosion with a small oral ulcer, decision made to take a pessary break until reevaluation in 4 weeks.  [place], it was a pleasure seeing you today!

## 2018-02-11 ENCOUNTER — Ambulatory Visit (INDEPENDENT_AMBULATORY_CARE_PROVIDER_SITE_OTHER): Payer: Medicare Other | Admitting: *Deleted

## 2018-02-11 DIAGNOSIS — J309 Allergic rhinitis, unspecified: Secondary | ICD-10-CM

## 2018-02-16 ENCOUNTER — Other Ambulatory Visit: Payer: Self-pay | Admitting: Physician Assistant

## 2018-02-25 ENCOUNTER — Ambulatory Visit (INDEPENDENT_AMBULATORY_CARE_PROVIDER_SITE_OTHER): Payer: Medicare Other | Admitting: *Deleted

## 2018-02-25 DIAGNOSIS — J309 Allergic rhinitis, unspecified: Secondary | ICD-10-CM

## 2018-03-02 ENCOUNTER — Ambulatory Visit (INDEPENDENT_AMBULATORY_CARE_PROVIDER_SITE_OTHER): Payer: Medicare Other | Admitting: *Deleted

## 2018-03-02 DIAGNOSIS — J309 Allergic rhinitis, unspecified: Secondary | ICD-10-CM

## 2018-03-04 ENCOUNTER — Ambulatory Visit: Payer: Medicare Other | Admitting: Obstetrics & Gynecology

## 2018-03-04 ENCOUNTER — Encounter: Payer: Self-pay | Admitting: Obstetrics & Gynecology

## 2018-03-04 VITALS — BP 140/78

## 2018-03-04 DIAGNOSIS — N899 Noninflammatory disorder of vagina, unspecified: Secondary | ICD-10-CM

## 2018-03-04 DIAGNOSIS — Z4689 Encounter for fitting and adjustment of other specified devices: Secondary | ICD-10-CM | POA: Diagnosis not present

## 2018-03-04 DIAGNOSIS — T8389XA Other specified complication of genitourinary prosthetic devices, implants and grafts, initial encounter: Secondary | ICD-10-CM

## 2018-03-04 NOTE — Progress Notes (Signed)
    Kathryn Davis June 01, 1950 [id number]        67 y.o.  G2P2002   RP: Pessary reinsertion  HPI: Had ulceration of the vaginal mucosa secondary to pessary on last gyn exam 02/04/2018.  Pessary removed since then to let the vagina heal.  Has a vaginal discharge with odor that resolved after removing the pessary.  But patient is bothered by the bulging in the vagina and also by frequent urination.  Desires reinsertion of the pessary.  Using Milex ring #4 with support.   OB History  Gravida Para Term Preterm AB Living  2 2 2     2   SAB TAB Ectopic Multiple Live Births               # Outcome Date GA Lbr Len/2nd Weight Sex Delivery Anes PTL Lv  2 Term           1 Term             Past medical history,surgical history, problem list, medications, allergies, family history and social history were all reviewed and documented in the EPIC chart.   Directed ROS with pertinent positives and negatives documented in the history of present illness/assessment and plan.  Exam:  Vitals:   03/04/18 0955  BP: 140/78   General appearance:  Normal  Gynecologic exam: Vulva normal.  Speculum: Vaginal mucosa well-healed.  No ulceration seen.  Pessary cleaned.  Reinserted easily.   Assessment/Plan:  67 y.o. G2P2002   1. Encounter for fitting and adjustment of pessary Vaginal mucosa well-healed after taking a break from the pessary for 4 weeks.  Reinsertion of pessary Milex ring #4 with support.  Fits well, but if we note irritation or ulceration again in 2 months, will recommend trying a smaller pessary, Milex ring #3 with support.  2. Vaginal irritation from pessary Result vaginal irritation and ulceration after 4 weeks without the pessary.  Counseling on above issues and coordination of care more than 50% for 15 minutes.  [name] MD, 10:06 AM 03/04/2018

## 2018-03-04 NOTE — Patient Instructions (Signed)
1. Encounter for fitting and adjustment of pessary Vaginal mucosa well-healed after taking a break from the pessary for 4 weeks.  Reinsertion of pessary Milex ring #4 with support.  Fits well, but if we note irritation or ulceration again in 2 months, will recommend trying a smaller pessary, Milex ring #3 with support.  2. Vaginal irritation from pessary Result vaginal irritation and ulceration after 4 weeks without the pessary.  [name], it was a pleasure seeing you today!

## 2018-03-09 ENCOUNTER — Ambulatory Visit (INDEPENDENT_AMBULATORY_CARE_PROVIDER_SITE_OTHER): Payer: Medicare Other | Admitting: *Deleted

## 2018-03-09 DIAGNOSIS — J309 Allergic rhinitis, unspecified: Secondary | ICD-10-CM

## 2018-03-22 ENCOUNTER — Telehealth: Payer: Self-pay

## 2018-03-22 NOTE — Telephone Encounter (Signed)
Qvar80 is no longer on patients formulary.  Alertnatives are [employer], [employer] and [employer].  Please advise.

## 2018-03-23 ENCOUNTER — Ambulatory Visit (INDEPENDENT_AMBULATORY_CARE_PROVIDER_SITE_OTHER): Payer: Medicare Other | Admitting: *Deleted

## 2018-03-23 DIAGNOSIS — J309 Allergic rhinitis, unspecified: Secondary | ICD-10-CM | POA: Diagnosis not present

## 2018-03-23 MED ORDER — FLUTICASONE FUROATE 200 MCG/ACT IN AEPB: 1.0000 | INHALATION_SPRAY | Freq: Every day | RESPIRATORY_TRACT | 1 refills | Status: DC

## 2018-03-23 NOTE — Telephone Encounter (Signed)
Called patient left message to return call we need inform patient of change. Prescription was sent in to pharmacy

## 2018-03-23 NOTE — Addendum Note (Signed)
Addended by: Orlene Erm on: 03/23/2018 09:08 AM   Modules accepted: Orders

## 2018-03-23 NOTE — Telephone Encounter (Signed)
Informed patient of change. And to have pharmacy review how to use inhaler.

## 2018-03-23 NOTE — Telephone Encounter (Signed)
Ok let have her do Arnuity 200  1 puff daily

## 2018-03-30 ENCOUNTER — Ambulatory Visit (INDEPENDENT_AMBULATORY_CARE_PROVIDER_SITE_OTHER): Payer: Medicare Other | Admitting: *Deleted

## 2018-03-30 DIAGNOSIS — J309 Allergic rhinitis, unspecified: Secondary | ICD-10-CM

## 2018-04-01 NOTE — Progress Notes (Signed)
VIALS EXP 04-06-19 

## 2018-04-06 ENCOUNTER — Ambulatory Visit (INDEPENDENT_AMBULATORY_CARE_PROVIDER_SITE_OTHER): Payer: Medicare Other | Admitting: *Deleted

## 2018-04-06 DIAGNOSIS — J309 Allergic rhinitis, unspecified: Secondary | ICD-10-CM

## 2018-04-09 DIAGNOSIS — J3089 Other allergic rhinitis: Secondary | ICD-10-CM | POA: Diagnosis not present

## 2018-04-12 ENCOUNTER — Ambulatory Visit (INDEPENDENT_AMBULATORY_CARE_PROVIDER_SITE_OTHER): Payer: Medicare Other | Admitting: *Deleted

## 2018-04-12 DIAGNOSIS — J309 Allergic rhinitis, unspecified: Secondary | ICD-10-CM

## 2018-04-13 DIAGNOSIS — J3089 Other allergic rhinitis: Secondary | ICD-10-CM | POA: Diagnosis not present

## 2018-04-22 ENCOUNTER — Ambulatory Visit (INDEPENDENT_AMBULATORY_CARE_PROVIDER_SITE_OTHER): Payer: Medicare Other | Admitting: *Deleted

## 2018-04-22 DIAGNOSIS — J309 Allergic rhinitis, unspecified: Secondary | ICD-10-CM

## 2018-04-27 ENCOUNTER — Ambulatory Visit (INDEPENDENT_AMBULATORY_CARE_PROVIDER_SITE_OTHER): Payer: Medicare Other | Admitting: *Deleted

## 2018-04-27 DIAGNOSIS — J309 Allergic rhinitis, unspecified: Secondary | ICD-10-CM | POA: Diagnosis not present

## 2018-05-06 ENCOUNTER — Ambulatory Visit: Payer: Medicare Other | Admitting: Obstetrics & Gynecology

## 2018-05-13 ENCOUNTER — Ambulatory Visit (INDEPENDENT_AMBULATORY_CARE_PROVIDER_SITE_OTHER): Payer: Medicare Other | Admitting: *Deleted

## 2018-05-13 DIAGNOSIS — J309 Allergic rhinitis, unspecified: Secondary | ICD-10-CM | POA: Diagnosis not present

## 2018-05-18 ENCOUNTER — Ambulatory Visit: Payer: Medicare Other | Admitting: Obstetrics & Gynecology

## 2018-05-18 ENCOUNTER — Encounter: Payer: Self-pay | Admitting: Obstetrics & Gynecology

## 2018-05-18 VITALS — BP 136/82

## 2018-05-18 DIAGNOSIS — Z4689 Encounter for fitting and adjustment of other specified devices: Secondary | ICD-10-CM

## 2018-05-18 NOTE — Progress Notes (Signed)
    Kathryn Davis 1950/07/25 [id number]        68 y.o.  G2P2002   RP: Pessary maintenance with Milex ring #4 with support  HPI: Patient had a pessary break between October and November because of some vaginal irritation with erosion.  Pessary was put back in place March 04, 2018.  Patient is doing well with the pessary.  No vaginal pain.  No vaginal bleeding.  No abnormal discharge.  Patient has tried to remove the pessary herself to clean it, but has not been successful removing it.   OB History  Gravida Para Term Preterm AB Living  2 2 2     2   SAB TAB Ectopic Multiple Live Births               # Outcome Date GA Lbr Len/2nd Weight Sex Delivery Anes PTL Lv  2 Term           1 Term             Past medical history,surgical history, problem list, medications, allergies, family history and social history were all reviewed and documented in the EPIC chart.   Directed ROS with pertinent positives and negatives documented in the history of present illness/assessment and plan.  Exam:  Vitals:   05/18/18 1004  BP: 136/82   General appearance:  Normal  Gynecologic exam: Vulva normal.  Pessary easily removed and cleaned.  Very mild vaginal discharge.  No bleeding.  Speculum exam: Cervix and vagina normal with mild scarring from previous granulation tissue and no sign of active inflammation.  No erosion, no ulceration.  Speculum removed.  Pessary put back in place.   Assessment/Plan:  68 y.o. G2P2002   1. Encounter for pessary maintenance Doing well with pessary Milex ring #4 with support.  No erosion or ulceration.  Pessary cleaned and put back in place.  We will follow-up in 4 months for pessary maintenance.  Counseling on above issues and coordination of care more than 50% for 15 minutes.  [name] MD, 10:27 AM 05/18/2018

## 2018-05-18 NOTE — Patient Instructions (Signed)
1. Encounter for pessary maintenance Doing well with pessary Milex ring #4 with support.  No erosion or ulceration.  Pessary cleaned and put back in place.  We will follow-up in 4 months for pessary maintenance.  Kathryn Davis, it was a pleasure seeing you today!

## 2018-05-25 ENCOUNTER — Ambulatory Visit (INDEPENDENT_AMBULATORY_CARE_PROVIDER_SITE_OTHER): Payer: Medicare Other | Admitting: *Deleted

## 2018-05-25 DIAGNOSIS — J309 Allergic rhinitis, unspecified: Secondary | ICD-10-CM | POA: Diagnosis not present

## 2018-06-01 ENCOUNTER — Ambulatory Visit (INDEPENDENT_AMBULATORY_CARE_PROVIDER_SITE_OTHER): Payer: Medicare Other | Admitting: *Deleted

## 2018-06-01 DIAGNOSIS — J309 Allergic rhinitis, unspecified: Secondary | ICD-10-CM | POA: Diagnosis not present

## 2018-06-09 ENCOUNTER — Other Ambulatory Visit: Payer: Self-pay | Admitting: *Deleted

## 2018-06-10 ENCOUNTER — Ambulatory Visit (INDEPENDENT_AMBULATORY_CARE_PROVIDER_SITE_OTHER): Payer: Medicare Other | Admitting: *Deleted

## 2018-06-10 DIAGNOSIS — J309 Allergic rhinitis, unspecified: Secondary | ICD-10-CM | POA: Diagnosis not present

## 2018-06-10 MED ORDER — FLUTICASONE FUROATE 200 MCG/ACT IN AEPB: 1.0000 | INHALATION_SPRAY | Freq: Every day | RESPIRATORY_TRACT | 0 refills | Status: DC

## 2018-06-22 ENCOUNTER — Ambulatory Visit (INDEPENDENT_AMBULATORY_CARE_PROVIDER_SITE_OTHER): Payer: Medicare Other | Admitting: *Deleted

## 2018-06-22 DIAGNOSIS — J309 Allergic rhinitis, unspecified: Secondary | ICD-10-CM

## 2018-06-30 ENCOUNTER — Telehealth: Payer: Self-pay | Admitting: Allergy

## 2018-06-30 DIAGNOSIS — J4520 Mild intermittent asthma, uncomplicated: Secondary | ICD-10-CM

## 2018-06-30 MED ORDER — ALBUTEROL SULFATE HFA 108 (90 BASE) MCG/ACT IN AERS: 2.0000 | INHALATION_SPRAY | RESPIRATORY_TRACT | 1 refills | Status: DC | PRN

## 2018-06-30 MED ORDER — BECLOMETHASONE DIPROP HFA 80 MCG/ACT IN AERB: 2.0000 | INHALATION_SPRAY | Freq: Two times a day (BID) | RESPIRATORY_TRACT | 0 refills | Status: DC

## 2018-06-30 NOTE — Telephone Encounter (Signed)
Courtesy refill sent in  

## 2018-06-30 NOTE — Telephone Encounter (Signed)
Called pt and she needs to have Albuterol and Qvar called into [facility] on [address] . [phone]

## 2018-07-01 ENCOUNTER — Other Ambulatory Visit: Payer: Self-pay

## 2018-07-01 ENCOUNTER — Ambulatory Visit (INDEPENDENT_AMBULATORY_CARE_PROVIDER_SITE_OTHER): Payer: Medicare Other | Admitting: *Deleted

## 2018-07-01 DIAGNOSIS — J309 Allergic rhinitis, unspecified: Secondary | ICD-10-CM | POA: Diagnosis not present

## 2018-07-07 ENCOUNTER — Encounter: Payer: Self-pay | Admitting: *Deleted

## 2018-07-08 ENCOUNTER — Ambulatory Visit: Payer: Medicare Other | Admitting: Allergy

## 2018-07-28 ENCOUNTER — Other Ambulatory Visit: Payer: Self-pay

## 2018-07-28 ENCOUNTER — Ambulatory Visit (INDEPENDENT_AMBULATORY_CARE_PROVIDER_SITE_OTHER): Payer: Medicare Other | Admitting: Allergy

## 2018-07-28 ENCOUNTER — Encounter: Payer: Self-pay | Admitting: Allergy

## 2018-07-28 DIAGNOSIS — J4520 Mild intermittent asthma, uncomplicated: Secondary | ICD-10-CM

## 2018-07-28 DIAGNOSIS — J3089 Other allergic rhinitis: Secondary | ICD-10-CM

## 2018-07-28 DIAGNOSIS — T7800XD Anaphylactic reaction due to unspecified food, subsequent encounter: Secondary | ICD-10-CM | POA: Diagnosis not present

## 2018-07-28 DIAGNOSIS — H1013 Acute atopic conjunctivitis, bilateral: Secondary | ICD-10-CM | POA: Diagnosis not present

## 2018-07-28 MED ORDER — EPINEPHRINE 0.3 MG/0.3ML IJ SOAJ: INTRAMUSCULAR | 1 refills | Status: DC

## 2018-07-28 MED ORDER — ALBUTEROL SULFATE HFA 108 (90 BASE) MCG/ACT IN AERS: 2.0000 | INHALATION_SPRAY | RESPIRATORY_TRACT | 1 refills | Status: DC | PRN

## 2018-07-28 MED ORDER — BECLOMETHASONE DIPROP HFA 80 MCG/ACT IN AERB: 2.0000 | INHALATION_SPRAY | Freq: Two times a day (BID) | RESPIRATORY_TRACT | 1 refills | Status: DC

## 2018-07-28 NOTE — Progress Notes (Signed)
Start:11:20 AM Location: home End: 11:57 AM

## 2018-07-29 NOTE — Progress Notes (Signed)
RE: LIN AHUMADA MRN: [id number] DOB: 1950-06-26 Date of Telemedicine Visit: 07/28/2018  Referring provider: Juluis Rainier, MD Primary care provider: Juluis Rainier, MD  Chief Complaint: Asthma and Allergic Rhinitis  (has stopped her allergy shots at this time due to covid-19)   Telemedicine Follow Up Visit via Telephone: I connected with Marguritte Florentino for a follow up on 07/29/18 by telephone and verified that I am speaking with the correct person using two identifiers.   I discussed the limitations, risks, security and privacy concerns of performing an evaluation and management service by telephone and the availability of in person appointments. I also discussed with the patient that there may be a patient responsible charge related to this service. The patient expressed understanding and agreed to proceed.  Patient is at home.   Provider is at the office.  Visit start time: 1120 Visit end time: 1157 Insurance consent/check in by: Marlene Bast Medical consent and medical assistant/nurse: Irwin Brakeman  History of Present Illness: She is a 68 y.o. female, who is being followed for allergic rhinitis with conjunctivitis, allergic asthma and shellfish allergy.  She also has likely sleep apnea. Her previous allergy office visit was on Sep 02, 2017 with Dr. Delorse Lek.   She states she has been doing well since her last visit with [place] without any major changes in her health, surgeries or hospitalizations.  She did have some work done under her house to ensure there is no mold or mildew issues.  She does believe that this has overall helped with her allergy related symptoms. She states at this time that she is not having any significant allergy symptoms and has not needed to use her Allegra or her nasal spray yet.  She is on allergen immunotherapy however with coronavirus she has stopped receiving her injections so that she can limit her risk of exposure. In regards to her allergic  asthma she has not had any further exacerbations since her last visit and has not required any further prednisone or systemic steroids, urgent care or ED visits..  She does continue to use her Qvar 2 puffs twice a day and states that she has not needed to use her rescue inhaler since her last visit.  She denies any nighttime awakenings. She continues to avoid mollusks and has not had any accidental ingestions or need to use her EpiPen.  Assessment and Plan: Jaxyn is a 68 y.o. female with:  Allergic rhinitis with conjunctivitis    -continue avoidance measures to grass, weed, tree, molds, dust mites and cat    -She can resume allergen immunotherapy when she is ready to.  I did discuss with her today that in the midst of coronavirus that we have taken steps to reduce significant exposure to other patients also receiving immunotherapy injections in the office.  We did discuss that we will have to decrease her dose down depending on how long she has a lapse between her injections.  When she is ready to resume she will continue to use her antihistamines on the days of her injections and to bring her EpiPen.    -For nasal drainage recommend using Astelin nasal spray 2 sprays each nostril 2 times a day    -continue daily Allegra 180mg  as needed and use only as of your injections  Allergic asthma   -At this time doing well   -Allergen exposure as above may trigger asthma symptoms.   -have access to albuterol inhaler 2 puffs every 4-6 hours as needed  for cough/wheeze/shortness of breath/chest tightness.  May use 15-20 minutes prior to activity.   Monitor frequency of use.     -Use Qvar redihaler 2 puffs twice a day.   During respiratory illnesses/flares may use Qvar 3 puffs 3 times a day for 1-2 weeks and go back to regular dosing once symptoms improve.      Shellfish allergy   -Continue avoidance of mollusks (clams, scallops, oysters)   -Have access to EpiPen as above in case of allergic reaction    -Follow emergency action plan in case of allergic reaction  Follow-up 4-6 months or sooner if needed  Diagnostics: Chest x-ray from May 2019 was clear.  Medication List:  Current Outpatient Medications  Medication Sig Dispense Refill  . albuterol (PROAIR HFA) 108 (90 Base) MCG/ACT inhaler Inhale 2 puffs into the lungs every 4 (four) hours as needed for wheezing or shortness of breath. 18 g 1  . atorvastatin (LIPITOR) 10 MG tablet Take 10 mg by mouth daily.  1  . beclomethasone (QVAR REDIHALER) 80 MCG/ACT inhaler Inhale 2 puffs into the lungs 2 (two) times daily. 31.8 g 1  . citalopram (CELEXA) 10 MG tablet Take 10 mg by mouth daily.    Marland Kitchen EPINEPHrine (EPIPEN 2-PAK) 0.3 mg/0.3 mL IJ SOAJ injection Use as directed for severe allergic reaction 2 Device 1  . Multiple Vitamin (MULTIVITAMIN PO) Take 1 tablet by mouth daily.     . salmeterol (SEREVENT) 50 MCG/DOSE diskus inhaler Inhale 1 puff into the lungs 2 (two) times daily.    . Fluticasone Furoate (ARNUITY ELLIPTA) 200 MCG/ACT AEPB Inhale 1 puff into the lungs daily. (Patient not taking: Reported on 07/28/2018) 30 each 0   No current facility-administered medications for this visit.    Allergies: Allergies  Allergen Reactions  . Shellfish Allergy Nausea And Vomiting   I reviewed her past medical history, social history, family history, and environmental history and no significant changes have been reported from previous visit on 09/02/2017.  Review of Systems  Constitutional: Negative for chills and fever.  HENT: Negative for congestion, postnasal drip, rhinorrhea and sneezing.   Eyes: Negative for pain, discharge and itching.  Respiratory: Negative for cough, chest tightness, shortness of breath and wheezing.   Cardiovascular: Negative for chest pain.  Gastrointestinal: Negative for abdominal pain, constipation, diarrhea, nausea and vomiting.  Musculoskeletal: Negative for myalgias.  Skin: Negative for rash.  Neurological: Negative  for headaches.   Objective: Physical Exam Not obtained as encounter was done via telephone.   Previous notes and tests were reviewed.  I discussed the assessment and treatment plan with the patient. The patient was provided an opportunity to ask questions and all were answered. The patient agreed with the plan and demonstrated an understanding of the instructions.   The patient was advised to call back or seek an in-person evaluation if the symptoms worsen or if the condition fails to improve as anticipated.  I provided 37 minutes of non-face-to-face time during this encounter.  It was my pleasure to participate in Shala Kolasinski care today. Please feel free to contact me with any questions or concerns.   Sincerely,  Keslie Gritz Larose Hires, MD

## 2018-07-29 NOTE — Patient Instructions (Addendum)
Allergic rhinitis with conjunctivitis    -continue avoidance measures to grass, weed, tree, molds, dust mites and cat    -She can resume allergen immunotherapy when she is ready to.  I did discuss with her today that in the midst of coronavirus that we have taken steps to reduce significant exposure to other patients also receiving immunotherapy injections in the office.  We did discuss that we will have to decrease her dose down depending on how long she has a lapse between her injections.  When she is ready to resume she will continue to use her antihistamines on the days of her injections and to bring her EpiPen.    -For nasal drainage recommend using Astelin nasal spray 2 sprays each nostril 2 times a day    -continue daily Allegra 180mg  as needed and use only as of your injections  Allergic asthma   -At this time doing well   -Allergen exposure as above may trigger asthma symptoms.   -have access to albuterol inhaler 2 puffs every 4-6 hours as needed for cough/wheeze/shortness of breath/chest tightness.  May use 15-20 minutes prior to activity.   Monitor frequency of use.     -Use Qvar redihaler 2 puffs twice a day.   During respiratory illnesses/flares may use Qvar 3 puffs 3 times a day for 1-2 weeks and go back to regular dosing once symptoms improve.      Shellfish allergy   -Continue avoidance of mollusks (clams, scallops, oysters)   -Have access to EpiPen as above in case of allergic reaction   -Follow emergency action plan in case of allergic reaction  Follow-up 4-6 months or sooner if needed

## 2018-08-04 ENCOUNTER — Other Ambulatory Visit: Payer: Self-pay | Admitting: Allergy

## 2018-09-16 ENCOUNTER — Ambulatory Visit: Payer: Medicare Other | Admitting: Obstetrics & Gynecology

## 2018-09-27 ENCOUNTER — Other Ambulatory Visit: Payer: Self-pay

## 2018-09-28 ENCOUNTER — Encounter: Payer: Self-pay | Admitting: Obstetrics & Gynecology

## 2018-09-28 ENCOUNTER — Ambulatory Visit (INDEPENDENT_AMBULATORY_CARE_PROVIDER_SITE_OTHER): Payer: Medicare Other | Admitting: Obstetrics & Gynecology

## 2018-09-28 VITALS — BP 138/84

## 2018-09-28 DIAGNOSIS — Z4689 Encounter for fitting and adjustment of other specified devices: Secondary | ICD-10-CM | POA: Diagnosis not present

## 2018-09-28 NOTE — Patient Instructions (Signed)
1. Encounter for pessary maintenance Well with pessary Milex ring #4 with support.  Vaginal mucosa intact without inflammation or erosion.  Patient informed and reassured.  Kathryn Davis, it was a pleasure seeing you today!

## 2018-09-28 NOTE — Progress Notes (Signed)
    Kathryn Davis 08/01/1950 [id number]        68 y.o.  G2P2002   RP: Pessary maintenance  HPI: Doing well with pessary, no abnormal vaginal discharge, no vaginal bleeding, no vaginal pain or pelvic pain.  No fever.  No urinary incontinence.  No symptoms of UTI.   OB History  Gravida Para Term Preterm AB Living  2 2 2     2   SAB TAB Ectopic Multiple Live Births               # Outcome Date GA Lbr Len/2nd Weight Sex Delivery Anes PTL Lv  2 Term           1 Term             Past medical history,surgical history, problem list, medications, allergies, family history and social history were all reviewed and documented in the EPIC chart.   Directed ROS with pertinent positives and negatives documented in the history of present illness/assessment and plan.  Exam:  Vitals:   09/28/18 0757  BP: 138/84   General appearance:  Normal   Gynecologic exam: Vulva normal.  Pessary removed easily and cleaned thoroughly.  Speculum exam: Cervix and vagina normal.  Normal vaginal secretions.  No inflammation or erosion of the vaginal mucosa.  Pessary reinserted without difficulty.  Well-tolerated by patient.   Assessment/Plan:  68 y.o. G2P2002   1. Encounter for pessary maintenance Well with pessary Milex ring #4 with support.  Vaginal mucosa intact without inflammation or erosion.  Patient informed and reassured.  Follow-up for annual gynecologic exam and pessary maintenance in 4 months.  Counseling on above issues and coordination of care more than 50% for 15 minutes.  [name] MD, 8:14 AM 09/28/2018

## 2018-12-30 ENCOUNTER — Ambulatory Visit: Payer: Medicare Other | Admitting: Allergy

## 2018-12-30 ENCOUNTER — Telehealth: Payer: Self-pay | Admitting: *Deleted

## 2018-12-30 ENCOUNTER — Other Ambulatory Visit: Payer: Self-pay

## 2018-12-30 ENCOUNTER — Encounter: Payer: Self-pay | Admitting: Allergy

## 2018-12-30 VITALS — BP 140/82 | HR 63 | Temp 98.0°F | Resp 16 | Ht 64.0 in

## 2018-12-30 DIAGNOSIS — J3089 Other allergic rhinitis: Secondary | ICD-10-CM | POA: Diagnosis not present

## 2018-12-30 DIAGNOSIS — T7800XD Anaphylactic reaction due to unspecified food, subsequent encounter: Secondary | ICD-10-CM | POA: Diagnosis not present

## 2018-12-30 DIAGNOSIS — H1013 Acute atopic conjunctivitis, bilateral: Secondary | ICD-10-CM | POA: Diagnosis not present

## 2018-12-30 DIAGNOSIS — J4520 Mild intermittent asthma, uncomplicated: Secondary | ICD-10-CM

## 2018-12-30 MED ORDER — QVAR REDIHALER 80 MCG/ACT IN AERB: 2.0000 | INHALATION_SPRAY | Freq: Two times a day (BID) | RESPIRATORY_TRACT | 1 refills | Status: DC

## 2018-12-30 NOTE — Telephone Encounter (Signed)
Per Dr. Nelva Bush patient will be restarting Green Vials on Schedule A. Vials have been mixed down. Patient is aware and will be in to restart Green vials.

## 2018-12-30 NOTE — Patient Instructions (Addendum)
Allergic rhinitis with conjunctivitis    -continue avoidance measures to grass, weed, tree, molds, dust mites and cat    -she can resume allergen immunotherapy at this time.  Continue to use your antihistamine on the days of your injections and to bring her EpiPen.    -For nasal drainage recommend using Astelin nasal spray 2 sprays each nostril 2 times a day as needed    -continue daily Allegra 180mg  as needed and use only as of your injections  Allergic asthma   -At this time doing well   -Allergen driven   -have access to albuterol inhaler 2 puffs every 4-6 hours as needed for cough/wheeze/shortness of breath/chest tightness.  May use 15-20 minutes prior to activity.   Monitor frequency of use.     -Use Qvar redihaler 2 puffs twice a day.   During respiratory illnesses/flares may use Qvar 3 puffs 3 times a day for 1-2 weeks and go back to regular dosing once symptoms improve.      Shellfish allergy   -Continue avoidance of mollusks (clams, scallops, oysters)   -Have access to EpiPen as above in case of allergic reaction   -Follow emergency action plan in case of allergic reaction  Follow-up 6 months or sooner if needed

## 2018-12-30 NOTE — Progress Notes (Signed)
Follow-up Note  RE: Kathryn Davis MRN: [id number] DOB: 03-31-1951 Date of Office Visit: 12/30/2018   History of present illness: COREA NEYRA is a 68 y.o. female presenting today for follow-up of allergic rhinitis with conjunctivitis, allergic asthma and food allergy.  She last had a telemed visit on 07/28/2018 by myself.  She state she has been doing well since this visit without any major health changes, surgeries or hospitalizations.  She states her asthma has been under good control as long as she continues on Qvar twice a day.  She states she has not required albuterol use on Qvar.  Denies nighttime awakenings.  No need for UC/ED visits or systemic steroid needs.   With her allergies she states she has not had much issues with spring and fall.  She did stop allergy shot when Covid hit in March but is interested in resuming at this time.  She was at maintenance dosing  She has Allegra and Astelin but states has not needed to use these.   She continues to avoid mollusks and has access to her epipen.      Review of systems: Review of Systems  Constitutional: Negative for chills, fever and malaise/fatigue.  HENT: Negative for congestion, ear discharge, nosebleeds and sore throat.   Eyes: Negative for pain, discharge and redness.  Respiratory: Negative for cough, shortness of breath and wheezing.   Cardiovascular: Negative for chest pain.  Gastrointestinal: Negative for abdominal pain, constipation, diarrhea, heartburn, nausea and vomiting.  Musculoskeletal: Negative for joint pain.  Skin: Negative for itching and rash.  Neurological: Negative for headaches.    All other systems negative unless noted above in HPI  Past medical/social/surgical/family history have been reviewed and are unchanged unless specifically indicated below.  No changes  Medication List: Allergies as of 12/30/2018      Reactions   Shellfish Allergy Nausea And Vomiting      Medication List        Accurate as of December 30, 2018  3:39 PM. If you have any questions, ask your nurse or doctor.        STOP taking these medications   Fluticasone Furoate 200 MCG/ACT Aepb Commonly known as: Arnuity Ellipta Stopped by: Omarrion Carmer Larose Hires, MD     TAKE these medications   albuterol 108 (90 Base) MCG/ACT inhaler Commonly known as: ProAir HFA Inhale 2 puffs into the lungs every 4 (four) hours as needed for wheezing or shortness of breath.   atorvastatin 10 MG tablet Commonly known as: LIPITOR Take 10 mg by mouth daily.   citalopram 10 MG tablet Commonly known as: CELEXA Take 10 mg by mouth daily.   EPINEPHrine 0.3 mg/0.3 mL Soaj injection Commonly known as: EpiPen 2-Pak Use as directed for severe allergic reaction   MULTIVITAMIN PO Take 1 tablet by mouth daily.   Qvar RediHaler 80 MCG/ACT inhaler Generic drug: beclomethasone Inhale 2 puffs into the lungs 2 (two) times daily.   salmeterol 50 MCG/DOSE diskus inhaler Commonly known as: SEREVENT Inhale 1 puff into the lungs 2 (two) times daily.       Known medication allergies: Allergies  Allergen Reactions  . Shellfish Allergy Nausea And Vomiting     Physical examination: Blood pressure 140/82, pulse 63, temperature 98 F (36.7 C), temperature source Temporal, resp. rate 16, height 5\' 4"  (1.626 m), SpO2 95 %.  General: Alert, interactive, in no acute distress. HEENT: PERRLA, TMs pearly gray, turbinates non-edematous without discharge, post-pharynx non erythematous. Neck: Supple  without lymphadenopathy. Lungs: Clear to auscultation without wheezing, rhonchi or rales. {no increased work of breathing. CV: Normal S1, S2 without murmurs. Abdomen: Nondistended, nontender. Skin: Warm and dry, without lesions or rashes. Extremities:  No clubbing, cyanosis or edema. Neuro:   Grossly intact.  Diagnositics/Labs:  Spirometry: FEV1: 1.69L 71%, FVC: 2.4L 77%.  Study improved from prior study  Assessment  and plan:   Allergic rhinitis with conjunctivitis    -continue avoidance measures to grass, weed, tree, molds, dust mites and cat    -she can resume allergen immunotherapy at this time.  Continue to use your antihistamine on the days of your injections and to bring her EpiPen.    -For nasal drainage recommend using Astelin nasal spray 2 sprays each nostril 2 times a day as needed    -continue daily Allegra 180mg  as needed and use only as of your injections  Allergic asthma   -At this time doing well   -Allergen driven   -have access to albuterol inhaler 2 puffs every 4-6 hours as needed for cough/wheeze/shortness of breath/chest tightness.  May use 15-20 minutes prior to activity.   Monitor frequency of use.     -Use Qvar redihaler 2 puffs twice a day.   During respiratory illnesses/flares may use Qvar 3 puffs 3 times a day for 1-2 weeks and go back to regular dosing once symptoms improve.      Shellfish allergy   -Continue avoidance of mollusks (clams, scallops, oysters)   -Have access to EpiPen as above in case of allergic reaction   -Follow emergency action plan in case of allergic reaction  Follow-up 6 months or sooner if needed  I appreciate the opportunity to take part in Shakiya's care. Please do not hesitate to contact me with questions.  Sincerely,   Margo Aye, MD Allergy/Immunology [facility]

## 2018-12-31 ENCOUNTER — Ambulatory Visit (INDEPENDENT_AMBULATORY_CARE_PROVIDER_SITE_OTHER): Payer: Medicare Other | Admitting: *Deleted

## 2018-12-31 DIAGNOSIS — J309 Allergic rhinitis, unspecified: Secondary | ICD-10-CM | POA: Diagnosis not present

## 2019-01-06 ENCOUNTER — Ambulatory Visit (INDEPENDENT_AMBULATORY_CARE_PROVIDER_SITE_OTHER): Payer: Medicare Other

## 2019-01-06 DIAGNOSIS — J309 Allergic rhinitis, unspecified: Secondary | ICD-10-CM | POA: Diagnosis not present

## 2019-01-12 ENCOUNTER — Ambulatory Visit (INDEPENDENT_AMBULATORY_CARE_PROVIDER_SITE_OTHER): Payer: Medicare Other

## 2019-01-12 DIAGNOSIS — J309 Allergic rhinitis, unspecified: Secondary | ICD-10-CM

## 2019-01-19 ENCOUNTER — Ambulatory Visit (INDEPENDENT_AMBULATORY_CARE_PROVIDER_SITE_OTHER): Payer: Medicare Other | Admitting: *Deleted

## 2019-01-19 DIAGNOSIS — J309 Allergic rhinitis, unspecified: Secondary | ICD-10-CM

## 2019-01-26 ENCOUNTER — Ambulatory Visit (INDEPENDENT_AMBULATORY_CARE_PROVIDER_SITE_OTHER): Payer: Medicare Other

## 2019-01-26 DIAGNOSIS — J309 Allergic rhinitis, unspecified: Secondary | ICD-10-CM

## 2019-02-01 ENCOUNTER — Ambulatory Visit (INDEPENDENT_AMBULATORY_CARE_PROVIDER_SITE_OTHER): Payer: Medicare Other

## 2019-02-01 ENCOUNTER — Other Ambulatory Visit: Payer: Self-pay

## 2019-02-01 DIAGNOSIS — J309 Allergic rhinitis, unspecified: Secondary | ICD-10-CM | POA: Diagnosis not present

## 2019-02-04 ENCOUNTER — Other Ambulatory Visit: Payer: Self-pay | Admitting: Family Medicine

## 2019-02-04 DIAGNOSIS — Z1231 Encounter for screening mammogram for malignant neoplasm of breast: Secondary | ICD-10-CM

## 2019-02-08 ENCOUNTER — Ambulatory Visit (INDEPENDENT_AMBULATORY_CARE_PROVIDER_SITE_OTHER): Payer: Medicare Other

## 2019-02-08 ENCOUNTER — Other Ambulatory Visit: Payer: Self-pay

## 2019-02-08 DIAGNOSIS — J309 Allergic rhinitis, unspecified: Secondary | ICD-10-CM

## 2019-02-09 ENCOUNTER — Ambulatory Visit: Payer: Medicare Other | Admitting: Obstetrics & Gynecology

## 2019-02-15 ENCOUNTER — Other Ambulatory Visit: Payer: Self-pay

## 2019-02-15 ENCOUNTER — Ambulatory Visit (INDEPENDENT_AMBULATORY_CARE_PROVIDER_SITE_OTHER): Payer: Medicare Other

## 2019-02-15 DIAGNOSIS — J309 Allergic rhinitis, unspecified: Secondary | ICD-10-CM

## 2019-02-22 ENCOUNTER — Ambulatory Visit (INDEPENDENT_AMBULATORY_CARE_PROVIDER_SITE_OTHER): Payer: Medicare Other

## 2019-02-22 ENCOUNTER — Other Ambulatory Visit: Payer: Self-pay

## 2019-02-22 DIAGNOSIS — J309 Allergic rhinitis, unspecified: Secondary | ICD-10-CM

## 2019-02-24 ENCOUNTER — Other Ambulatory Visit: Payer: Self-pay

## 2019-02-25 ENCOUNTER — Encounter: Payer: Self-pay | Admitting: Obstetrics & Gynecology

## 2019-02-25 ENCOUNTER — Ambulatory Visit: Payer: Medicare Other | Admitting: Obstetrics & Gynecology

## 2019-02-25 VITALS — BP 134/86

## 2019-02-25 DIAGNOSIS — Z4689 Encounter for fitting and adjustment of other specified devices: Secondary | ICD-10-CM | POA: Diagnosis not present

## 2019-02-25 NOTE — Patient Instructions (Signed)
1. Encounter for pessary maintenance Well with Milex pessary ring #4 with support.  Vaginal mucosa intact.  Pessary is a good fit.  We will continue with same pessary management.  Follow-up in 4 months for annual gynecologic exam and pessary maintenance.  Ginnette, it was a pleasure seeing you today!

## 2019-02-25 NOTE — Progress Notes (Signed)
    Kathryn Davis 1951/01/25 [id number]        68 y.o.  G2P2002 Single.  Grand-Children 6 and 1 yo.  RP: Pessary maintenance  HPI: Well with Milex ring #4 with support.  No vaginal pain.  No bleeding.  No abnormal vaginal discharge.  Minimal SUI.     OB History  Gravida Para Term Preterm AB Living  2 2 2     2   SAB TAB Ectopic Multiple Live Births               # Outcome Date GA Lbr Len/2nd Weight Sex Delivery Anes PTL Lv  2 Term           1 Term             Past medical history,surgical history, problem list, medications, allergies, family history and social history were all reviewed and documented in the EPIC chart.   Directed ROS with pertinent positives and negatives documented in the history of present illness/assessment and plan.  Exam:  Vitals:   02/25/19 1609  BP: 134/86   General appearance:  Normal   Gynecologic exam: Vulva normal.  Pessary removed and cleaned.  Minimal d/c.  Vaginal mucosa intact.  Pessary put back in place easily.   Assessment/Plan:  68 y.o. G2P2002   1. Encounter for pessary maintenance Well with Milex pessary ring #4 with support.  Vaginal mucosa intact.  Pessary is a good fit.  We will continue with same pessary management.  Follow-up in 4 months for annual gynecologic exam and pessary maintenance.  Counseling on above issues and coordination of care more than 50% for 15 minutes.  [name] MD, 4:14 PM 02/25/2019

## 2019-02-28 DIAGNOSIS — J3089 Other allergic rhinitis: Secondary | ICD-10-CM | POA: Diagnosis not present

## 2019-02-28 NOTE — Progress Notes (Signed)
Vials exp 02-28-20

## 2019-03-01 DIAGNOSIS — J301 Allergic rhinitis due to pollen: Secondary | ICD-10-CM | POA: Diagnosis not present

## 2019-03-08 ENCOUNTER — Other Ambulatory Visit: Payer: Self-pay

## 2019-03-08 ENCOUNTER — Ambulatory Visit (INDEPENDENT_AMBULATORY_CARE_PROVIDER_SITE_OTHER): Payer: Medicare Other

## 2019-03-08 DIAGNOSIS — J309 Allergic rhinitis, unspecified: Secondary | ICD-10-CM

## 2019-03-15 ENCOUNTER — Ambulatory Visit (INDEPENDENT_AMBULATORY_CARE_PROVIDER_SITE_OTHER): Payer: Medicare Other

## 2019-03-15 ENCOUNTER — Other Ambulatory Visit: Payer: Self-pay

## 2019-03-15 DIAGNOSIS — J309 Allergic rhinitis, unspecified: Secondary | ICD-10-CM

## 2019-03-22 ENCOUNTER — Ambulatory Visit
Admission: RE | Admit: 2019-03-22 | Discharge: 2019-03-22 | Disposition: A | Discharge status: Home / Self Care | Payer: Medicare Other | Source: Ambulatory Visit | Attending: Family Medicine | Admitting: Family Medicine

## 2019-03-22 ENCOUNTER — Other Ambulatory Visit: Payer: Self-pay

## 2019-03-22 ENCOUNTER — Ambulatory Visit (INDEPENDENT_AMBULATORY_CARE_PROVIDER_SITE_OTHER): Payer: Medicare Other

## 2019-03-22 DIAGNOSIS — J309 Allergic rhinitis, unspecified: Secondary | ICD-10-CM

## 2019-03-22 DIAGNOSIS — Z1231 Encounter for screening mammogram for malignant neoplasm of breast: Secondary | ICD-10-CM

## 2019-03-29 ENCOUNTER — Ambulatory Visit (INDEPENDENT_AMBULATORY_CARE_PROVIDER_SITE_OTHER): Payer: Medicare Other

## 2019-03-29 ENCOUNTER — Other Ambulatory Visit: Payer: Self-pay

## 2019-03-29 DIAGNOSIS — J309 Allergic rhinitis, unspecified: Secondary | ICD-10-CM

## 2019-04-05 ENCOUNTER — Ambulatory Visit (INDEPENDENT_AMBULATORY_CARE_PROVIDER_SITE_OTHER): Payer: Medicare Other

## 2019-04-05 ENCOUNTER — Other Ambulatory Visit: Payer: Self-pay

## 2019-04-05 DIAGNOSIS — J309 Allergic rhinitis, unspecified: Secondary | ICD-10-CM | POA: Diagnosis not present

## 2019-04-19 ENCOUNTER — Ambulatory Visit (INDEPENDENT_AMBULATORY_CARE_PROVIDER_SITE_OTHER): Payer: Medicare PPO

## 2019-04-19 ENCOUNTER — Other Ambulatory Visit: Payer: Self-pay

## 2019-04-19 DIAGNOSIS — J309 Allergic rhinitis, unspecified: Secondary | ICD-10-CM | POA: Diagnosis not present

## 2019-04-26 ENCOUNTER — Ambulatory Visit (INDEPENDENT_AMBULATORY_CARE_PROVIDER_SITE_OTHER): Payer: Medicare PPO

## 2019-04-26 ENCOUNTER — Other Ambulatory Visit: Payer: Self-pay

## 2019-04-26 DIAGNOSIS — J309 Allergic rhinitis, unspecified: Secondary | ICD-10-CM | POA: Diagnosis not present

## 2019-04-27 ENCOUNTER — Other Ambulatory Visit: Payer: Self-pay | Admitting: Physician Assistant

## 2019-05-03 ENCOUNTER — Other Ambulatory Visit: Payer: Self-pay

## 2019-05-03 ENCOUNTER — Ambulatory Visit (INDEPENDENT_AMBULATORY_CARE_PROVIDER_SITE_OTHER): Payer: Medicare PPO

## 2019-05-03 DIAGNOSIS — J309 Allergic rhinitis, unspecified: Secondary | ICD-10-CM

## 2019-05-10 ENCOUNTER — Other Ambulatory Visit: Payer: Self-pay

## 2019-05-10 ENCOUNTER — Ambulatory Visit (INDEPENDENT_AMBULATORY_CARE_PROVIDER_SITE_OTHER): Payer: Medicare PPO

## 2019-05-10 DIAGNOSIS — J309 Allergic rhinitis, unspecified: Secondary | ICD-10-CM | POA: Diagnosis not present

## 2019-05-15 ENCOUNTER — Ambulatory Visit: Payer: Medicare Other

## 2019-05-17 ENCOUNTER — Other Ambulatory Visit: Payer: Self-pay

## 2019-05-17 ENCOUNTER — Ambulatory Visit (INDEPENDENT_AMBULATORY_CARE_PROVIDER_SITE_OTHER): Payer: Medicare PPO

## 2019-05-17 DIAGNOSIS — J309 Allergic rhinitis, unspecified: Secondary | ICD-10-CM

## 2019-05-23 ENCOUNTER — Ambulatory Visit: Payer: Medicare PPO | Attending: Internal Medicine

## 2019-05-23 DIAGNOSIS — Z23 Encounter for immunization: Secondary | ICD-10-CM | POA: Insufficient documentation

## 2019-05-23 NOTE — Progress Notes (Signed)
   Covid-19 Vaccination Clinic  Name:  Kathryn Davis    MRN: [id number] DOB: 1950/12/04  05/23/2019  Kathryn Davis was observed post Covid-19 immunization for 15 minutes without incidence. She was provided with Vaccine Information Sheet and instruction to access the V-Safe system.   Kathryn Davis was instructed to call 911 with any severe reactions post vaccine: Marland Kitchen Difficulty breathing  . Swelling of your face and throat  . A fast heartbeat  . A bad rash all over your body  . Dizziness and weakness    Immunizations Administered    Name Date Dose VIS Date Route   Pfizer COVID-19 Vaccine 05/23/2019 12:56 PM 0.3 mL 03/25/2019 Intramuscular   Manufacturer: [place]   Lot: [id number]   [place]: [id number]

## 2019-05-26 ENCOUNTER — Ambulatory Visit: Payer: Medicare Other

## 2019-05-31 ENCOUNTER — Ambulatory Visit (INDEPENDENT_AMBULATORY_CARE_PROVIDER_SITE_OTHER): Payer: Medicare PPO

## 2019-05-31 ENCOUNTER — Other Ambulatory Visit: Payer: Self-pay

## 2019-05-31 DIAGNOSIS — J309 Allergic rhinitis, unspecified: Secondary | ICD-10-CM | POA: Diagnosis not present

## 2019-06-09 ENCOUNTER — Other Ambulatory Visit: Payer: Self-pay

## 2019-06-09 ENCOUNTER — Ambulatory Visit (INDEPENDENT_AMBULATORY_CARE_PROVIDER_SITE_OTHER): Payer: Medicare PPO

## 2019-06-09 ENCOUNTER — Other Ambulatory Visit: Payer: Self-pay | Admitting: Allergy

## 2019-06-09 DIAGNOSIS — J309 Allergic rhinitis, unspecified: Secondary | ICD-10-CM

## 2019-06-09 DIAGNOSIS — J4520 Mild intermittent asthma, uncomplicated: Secondary | ICD-10-CM

## 2019-06-09 MED ORDER — QVAR REDIHALER 80 MCG/ACT IN AERB: 2.0000 | INHALATION_SPRAY | Freq: Two times a day (BID) | RESPIRATORY_TRACT | 1 refills | Status: DC

## 2019-06-09 NOTE — Telephone Encounter (Signed)
Patient called stating that she is 1 dose short of her Qvar inhaler. Patient states for the last 7-8 months she has been 1 dose short because she has had to use it more. Patient states her pharmacy will not let her get a refill until 06/15/2019. Patient would like to know if there is anything we can do because she does not like to go without her Qvar.  Please advise.

## 2019-06-09 NOTE — Telephone Encounter (Signed)
Call to pharmacy, pt just needs a refill according to pharmacist.  Refill has been sent in for Qvar.  Call to patient to make her aware, pt verbalized understanding, call ended.

## 2019-06-14 ENCOUNTER — Ambulatory Visit (INDEPENDENT_AMBULATORY_CARE_PROVIDER_SITE_OTHER): Payer: Medicare PPO

## 2019-06-14 ENCOUNTER — Other Ambulatory Visit: Payer: Self-pay

## 2019-06-14 DIAGNOSIS — J309 Allergic rhinitis, unspecified: Secondary | ICD-10-CM

## 2019-06-14 NOTE — Progress Notes (Signed)
VIAL EXP 06-13-20

## 2019-06-15 DIAGNOSIS — J3089 Other allergic rhinitis: Secondary | ICD-10-CM | POA: Diagnosis not present

## 2019-06-16 DIAGNOSIS — J301 Allergic rhinitis due to pollen: Secondary | ICD-10-CM | POA: Diagnosis not present

## 2019-06-20 ENCOUNTER — Ambulatory Visit: Payer: Medicare PPO | Attending: Internal Medicine

## 2019-06-20 DIAGNOSIS — Z23 Encounter for immunization: Secondary | ICD-10-CM | POA: Insufficient documentation

## 2019-06-20 NOTE — Progress Notes (Signed)
   Covid-19 Vaccination Clinic  Name:  Kathryn Davis    MRN: [id number] DOB: 12/05/1950  06/20/2019  Kathryn Davis was observed post Covid-19 immunization for 15 minutes without incident. She was provided with Vaccine Information Sheet and instruction to access the V-Safe system.   Kathryn Davis was instructed to call 911 with any severe reactions post vaccine: Marland Kitchen Difficulty breathing  . Swelling of face and throat  . A fast heartbeat  . A bad rash all over body  . Dizziness and weakness   Immunizations Administered    Name Date Dose VIS Date Route   Pfizer COVID-19 Vaccine 06/20/2019  1:07 PM 0.3 mL 03/25/2019 Intramuscular   Manufacturer: [place]   Lot: [id number]   [place]: [id number]

## 2019-06-22 DIAGNOSIS — J4531 Mild persistent asthma with (acute) exacerbation: Secondary | ICD-10-CM | POA: Diagnosis not present

## 2019-06-22 DIAGNOSIS — F419 Anxiety disorder, unspecified: Secondary | ICD-10-CM | POA: Diagnosis not present

## 2019-06-22 DIAGNOSIS — E559 Vitamin D deficiency, unspecified: Secondary | ICD-10-CM | POA: Diagnosis not present

## 2019-06-22 DIAGNOSIS — R7303 Prediabetes: Secondary | ICD-10-CM | POA: Diagnosis not present

## 2019-06-22 DIAGNOSIS — Z0000 Encounter for general adult medical examination without abnormal findings: Secondary | ICD-10-CM | POA: Diagnosis not present

## 2019-06-22 DIAGNOSIS — E782 Mixed hyperlipidemia: Secondary | ICD-10-CM | POA: Diagnosis not present

## 2019-06-28 ENCOUNTER — Ambulatory Visit (INDEPENDENT_AMBULATORY_CARE_PROVIDER_SITE_OTHER): Payer: Medicare PPO

## 2019-06-28 ENCOUNTER — Other Ambulatory Visit: Payer: Self-pay

## 2019-06-28 DIAGNOSIS — J309 Allergic rhinitis, unspecified: Secondary | ICD-10-CM

## 2019-06-30 ENCOUNTER — Other Ambulatory Visit: Payer: Self-pay

## 2019-06-30 ENCOUNTER — Ambulatory Visit: Payer: Medicare PPO | Admitting: Allergy

## 2019-06-30 ENCOUNTER — Encounter: Payer: Self-pay | Admitting: Allergy

## 2019-06-30 VITALS — BP 142/86 | HR 74 | Temp 97.4°F | Resp 18

## 2019-06-30 DIAGNOSIS — J3089 Other allergic rhinitis: Secondary | ICD-10-CM

## 2019-06-30 DIAGNOSIS — T7800XD Anaphylactic reaction due to unspecified food, subsequent encounter: Secondary | ICD-10-CM

## 2019-06-30 DIAGNOSIS — H1013 Acute atopic conjunctivitis, bilateral: Secondary | ICD-10-CM

## 2019-06-30 DIAGNOSIS — J4520 Mild intermittent asthma, uncomplicated: Secondary | ICD-10-CM | POA: Diagnosis not present

## 2019-06-30 MED ORDER — QVAR REDIHALER 80 MCG/ACT IN AERB: 2.0000 | INHALATION_SPRAY | Freq: Two times a day (BID) | RESPIRATORY_TRACT | 1 refills | Status: DC

## 2019-06-30 MED ORDER — AZELASTINE HCL 0.1 % NA SOLN: 2.0000 | Freq: Two times a day (BID) | NASAL | 5 refills | Status: DC

## 2019-06-30 NOTE — Progress Notes (Signed)
Follow-up Note  RE: TOYNA Davis MRN: [id number] DOB: 1951/02/29 Date of Office Visit: 06/30/2019   History of present illness: Kathryn Davis is a 69 y.o. female presenting today for follow-up of.  Allergic rhinitis with conjunctivitis, allergic asthma and food allergy.  She was last seen in the office on December 30, 2018 by myself.  She states she has been doing well since her last visit as she has been mostly home.  She states she has been doing an exercise program on Zoom for the past 6 months.  She states she has been able to garden without any significant issues.  She states she did receive her Covid vaccine and she is now 2 weeks out from her second dose that she tolerated well.  She plans to visit to [place] to take care of her mother for 2 weeks in June.  She plans to drive to [place]. She states with her allergies she has been doing quite well.  She is not on any of her allergy medications which would include Allegra and Astelin.  She did resume her allergen immunotherapy last year and is doing well with her injections without any large local or systemic symptoms. With her allergic asthma she states that has been doing well.  She is not needed to use her rescue inhaler.  She is using her Qvar 2 puffs twice a day.  She has not had any respiratory illnesses or needs to increase her Qvar dosing. She continues to avoid shellfish and has access to an epinephrine device.  Review of systems: Review of Systems  Constitutional: Negative.   HENT: Negative.   Eyes: Negative.   Respiratory: Negative.   Cardiovascular: Negative.   Gastrointestinal: Negative.   Musculoskeletal: Negative.   Skin: Negative.   Neurological: Negative.     All other systems negative unless noted above in HPI  Past medical/social/surgical/family history have been reviewed and are unchanged unless specifically indicated below.  No changes  Medication List: Current Outpatient  Medications  Medication Sig Dispense Refill  . albuterol (PROAIR HFA) 108 (90 Base) MCG/ACT inhaler Inhale 2 puffs into the lungs every 4 (four) hours as needed for wheezing or shortness of breath. 18 g 1  . atorvastatin (LIPITOR) 10 MG tablet Take 10 mg by mouth daily.  1  . beclomethasone (QVAR REDIHALER) 80 MCG/ACT inhaler Inhale 2 puffs into the lungs 2 (two) times daily. 31.8 g 1  . citalopram (CELEXA) 10 MG tablet Take 10 mg by mouth daily.    Marland Kitchen EPINEPHrine (EPIPEN 2-PAK) 0.3 mg/0.3 mL IJ SOAJ injection Use as directed for severe allergic reaction 2 Device 1  . Multiple Vitamin (MULTIVITAMIN PO) Take 1 tablet by mouth daily.     Marland Kitchen azelastine (ASTELIN) 0.1 % nasal spray Place 2 sprays into both nostrils 2 (two) times daily. 30 mL 5   No current facility-administered medications for this visit.     Known medication allergies: Allergies  Allergen Reactions  . Shellfish Allergy Nausea And Vomiting     Physical examination: Blood pressure (!) 142/86, pulse 74, temperature (!) 97.4 F (36.3 C), temperature source Temporal, resp. rate 18, SpO2 96 %.  General: Alert, interactive, in no acute distress. HEENT: PERRLA, TMs pearly gray, turbinates non-edematous without discharge, post-pharynx non erythematous. Neck: Supple without lymphadenopathy. Lungs: Clear to auscultation without wheezing, rhonchi or rales. {no increased work of breathing. CV: Normal S1, S2 without murmurs. Abdomen: Nondistended, nontender. Skin: Warm and dry, without lesions or rashes. Extremities:  No clubbing, cyanosis or edema. Neuro:   Grossly intact.  Diagnositics/Labs:  Spirometry: FEV1: 1.62 L 68%, FVC: 2.19 L 70% predicted.  This is a restrictive pattern however this is somewhat stable from previous studies.   Assessment and plan:   Allergic rhinitis with conjunctivitis    -continue avoidance measures to grass, weed, tree, molds, dust mites and cat    - continue allergen immunotherapy per schedule.   Continue to use your antihistamine on the days of your injections and to bring her EpiPen.    -For nasal drainage recommend using Astelin nasal spray 2 sprays each nostril 2 times a day as needed    -continue daily Allegra 180mg  as needed and on days of your injections  Allergic asthma   -At this time doing well   -Allergen driven   -have access to albuterol inhaler 2 puffs every 4-6 hours as needed for cough/wheeze/shortness of breath/chest tightness.  May use 15-20 minutes prior to activity.   Monitor frequency of use.     - will try to wean down Qvar to 1 puff twice a day.    If you note any increase in symptoms or not meeting the below goals then go back to Qvar 2 puffs twice a day.    - Action Plan: During respiratory illnesses/flares may use Qvar 3 puffs 3 times a day for 1-2 weeks and go back to regular dosing once symptoms improve.      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 Please do not hesitate to contact me with questions.  Sincerely,   Prudy Feeler, MD Allergy/Immunology [facility]

## 2019-06-30 NOTE — Patient Instructions (Signed)
Allergic rhinitis with conjunctivitis    -continue avoidance measures to grass, weed, tree, molds, dust mites and cat    - continue allergen immunotherapy per schedule.  Continue to use your antihistamine on the days of your injections and to bring her EpiPen.    -For nasal drainage recommend using Astelin nasal spray 2 sprays each nostril 2 times a day as needed    -continue daily Allegra 180mg  as needed and on days of your injections  Allergic asthma   -At this time doing well   -Allergen driven   -have access to albuterol inhaler 2 puffs every 4-6 hours as needed for cough/wheeze/shortness of breath/chest tightness.  May use 15-20 minutes prior to activity.   Monitor frequency of use.     - will try to wean down Qvar to 1 puff twice a day.    If you note any increase in symptoms or not meeting the below goals then go back to Qvar 2 puffs twice a day.    - Action Plan: During respiratory illnesses/flares may use Qvar 3 puffs 3 times a day for 1-2 weeks and go back to regular dosing once symptoms improve.      Control goals:   Full participation in all desired activities (may need albuterol before activity)  Albuterol use two time or less a week on average (not counting use with activity)  Cough interfering with sleep two time or less a month  Oral steroids no more than once a year  No hospitalizations  Shellfish allergy   -Continue avoidance of mollusks (clams, scallops, oysters)   -Have access to EpiPen as above in case of allergic reaction   -Follow emergency action plan in case of allergic reaction  Follow-up 6 months or sooner if needed

## 2019-07-07 ENCOUNTER — Ambulatory Visit (INDEPENDENT_AMBULATORY_CARE_PROVIDER_SITE_OTHER): Payer: Medicare PPO

## 2019-07-07 ENCOUNTER — Other Ambulatory Visit: Payer: Self-pay

## 2019-07-07 DIAGNOSIS — J309 Allergic rhinitis, unspecified: Secondary | ICD-10-CM

## 2019-07-08 ENCOUNTER — Encounter: Payer: Self-pay | Admitting: Obstetrics & Gynecology

## 2019-07-08 ENCOUNTER — Ambulatory Visit: Payer: Medicare PPO | Admitting: Obstetrics & Gynecology

## 2019-07-08 VITALS — BP 134/86 | Ht 64.0 in | Wt 177.0 lb

## 2019-07-08 DIAGNOSIS — Z6830 Body mass index (BMI) 30.0-30.9, adult: Secondary | ICD-10-CM

## 2019-07-08 DIAGNOSIS — E6609 Other obesity due to excess calories: Secondary | ICD-10-CM

## 2019-07-08 DIAGNOSIS — Z4689 Encounter for fitting and adjustment of other specified devices: Secondary | ICD-10-CM | POA: Diagnosis not present

## 2019-07-08 DIAGNOSIS — R21 Rash and other nonspecific skin eruption: Secondary | ICD-10-CM | POA: Diagnosis not present

## 2019-07-08 DIAGNOSIS — Z780 Asymptomatic menopausal state: Secondary | ICD-10-CM

## 2019-07-08 DIAGNOSIS — Z01419 Encounter for gynecological examination (general) (routine) without abnormal findings: Secondary | ICD-10-CM

## 2019-07-08 NOTE — Progress Notes (Signed)
Kathryn Davis [birth year] [id number]   History:    69 y.o. G2P2L2 Single  RP:  Established patient presenting for annual gyn exam   HPI: Menopause, well on no HRT.  Abstinent x many years.  Well on Milex ring #4 with support.  No vaginal d/c, no bleeding and no odor or itching.  No pelvic pain.  No stress urinary incontinence or urgency.  No fever. Breasts normal. Body mass index 30.38.  Doing [place] 3 times a week.  Health labs with family physician.  Neck and chest rash x 2 weeks, itchy.  Past medical history,surgical history, family history and social history were all reviewed and documented in the EPIC chart.  Gynecologic History No LMP recorded. Patient is postmenopausal.  Obstetric History OB History  Gravida Para Term Preterm AB Living  2 2 2     2   SAB TAB Ectopic Multiple Live Births               # Outcome Date GA Lbr Len/2nd Weight Sex Delivery Anes PTL Lv  2 Term           1 Term              ROS: A ROS was performed and pertinent positives and negatives are included in the history.  GENERAL: No fevers or chills. HEENT: No change in vision, no earache, sore throat or sinus congestion. NECK: No pain or stiffness. CARDIOVASCULAR: No chest pain or pressure. No palpitations. PULMONARY: No shortness of breath, cough or wheeze. GASTROINTESTINAL: No abdominal pain, nausea, vomiting or diarrhea, melena or bright red blood per rectum. GENITOURINARY: No urinary frequency, urgency, hesitancy or dysuria. MUSCULOSKELETAL: No joint or muscle pain, no back pain, no recent trauma. DERMATOLOGIC: No rash, no itching, no lesions. ENDOCRINE: No polyuria, polydipsia, no heat or cold intolerance. No recent change in weight. HEMATOLOGICAL: No anemia or easy bruising or bleeding. NEUROLOGIC: No headache, seizures, numbness, tingling or weakness. PSYCHIATRIC: No depression, no loss of interest in normal activity or change in sleep pattern.     Exam:   BP 134/86   Ht 5\' 4"  (1.626  m)   Wt 177 lb (80.3 kg)   BMI 30.38 kg/m   Body mass index is 30.38 kg/m.  General appearance : Well developed well nourished female. No acute distress HEENT: Eyes: no retinal hemorrhage or exudates,  Neck supple, trachea midline, no carotid bruits, no thyroidmegaly.  Reactive LN felt bilaterally.  Neck and chest rash present. Lungs: Clear to auscultation, no rhonchi or wheezes, or rib retractions  Heart: Regular rate and rhythm, no murmurs or gallops Breast:Examined in sitting and supine position were symmetrical in appearance, no palpable masses or tenderness,  no skin retraction, no nipple inversion, no nipple discharge, no skin discoloration, no axillary or supraclavicular lymphadenopathy Abdomen: no palpable masses or tenderness, no rebound or guarding Extremities: no edema or skin discoloration or tenderness  Pelvic: Vulva: Normal             Vagina: No gross lesions or discharge.  Pessary removed, cleaned with Antibacterial soap and reinserted in the vagina.  Cervix: No gross lesions or discharge  Uterus  AV, normal size, shape and consistency, non-tender and mobile  Adnexa  Without masses or tenderness  Anus: Normal   Assessment/Plan:  69 y.o. female for annual exam   1. Well female exam with routine gynecological exam Gynecologic exam with pelvic relaxation using a pessary.  Last Pap test October 2019 was negative,  no indication to repeat this year.  Breast exam normal.  Mammogram December 2020 was negative.  Colonoscopy 2016.  Health labs with family physician.  2. Postmenopausal Bone Density normal in 2014.  Recommend a repeat Bone Density now.  Will check with Insurance and schedule after.  Recommend vitamin D supplements, calcium intake of 1200 mg daily and regular weightbearing physical activities.  3. Encounter for pessary maintenance Well with Milex ring #4 with support.  Pessary removed cleaned and put back in place.  Normal vaginal mucosa with no inflammation or  ulceration.  4. Class 1 obesity due to excess calories without serious comorbidity with body mass index (BMI) of 30.0 to 30.9 in adult Recommend a lower calorie/carb diet such as [employer].  Aerobic activities to continue, patient is doing [place]oga and light weightlifting every 2 days recommended.  5. Rash of neck and chest Rash of neck and chest x2 weeks with reactive lymph nodes felt at the base of the neck bilaterally.  Could correspond to Covid immunization with last dose 3 weeks ago.  Patient will consult with her family physician for evaluation and management.  [name] MD, 4:26 PM 07/08/2019

## 2019-07-08 NOTE — Patient Instructions (Signed)
1. Well female exam with routine gynecological exam Gynecologic exam with pelvic relaxation using a pessary.  Last Pap test October 2019 was negative, no indication to repeat this year.  Breast exam normal.  Mammogram December 2020 was negative.  Colonoscopy 2016.  Health labs with family physician.  2. Postmenopausal Bone Density normal in 2014.  Recommend a repeat Bone Density now.  Will check with Insurance and schedule after.  Recommend vitamin D supplements, calcium intake of 1200 mg daily and regular weightbearing physical activities.  3. Encounter for pessary maintenance Well with Milex ring #4 with support.  Pessary removed cleaned and put back in place.  Normal vaginal mucosa with no inflammation or ulceration.  4. Class 1 obesity due to excess calories without serious comorbidity with body mass index (BMI) of 30.0 to 30.9 in adult Recommend a lower calorie/carb diet such as [employer].  Aerobic activities to continue, patient is doing [place] and light weightlifting every 2 days recommended.  5. Rash of neck and chest Rash of neck and chest x2 weeks with reactive lymph nodes felt at the base of the neck bilaterally.  Could correspond to Covid immunization with last dose 3 weeks ago.  Patient will consult with her family physician for evaluation and management.  Jenae, it was a pleasure seeing you!

## 2019-07-09 DIAGNOSIS — R21 Rash and other nonspecific skin eruption: Secondary | ICD-10-CM | POA: Diagnosis not present

## 2019-07-12 ENCOUNTER — Ambulatory Visit (INDEPENDENT_AMBULATORY_CARE_PROVIDER_SITE_OTHER): Payer: Medicare PPO

## 2019-07-12 ENCOUNTER — Other Ambulatory Visit: Payer: Self-pay

## 2019-07-12 DIAGNOSIS — J309 Allergic rhinitis, unspecified: Secondary | ICD-10-CM

## 2019-07-19 DIAGNOSIS — H2513 Age-related nuclear cataract, bilateral: Secondary | ICD-10-CM | POA: Diagnosis not present

## 2019-07-21 ENCOUNTER — Other Ambulatory Visit: Payer: Self-pay

## 2019-07-21 ENCOUNTER — Ambulatory Visit (INDEPENDENT_AMBULATORY_CARE_PROVIDER_SITE_OTHER): Payer: Medicare PPO

## 2019-07-21 DIAGNOSIS — J309 Allergic rhinitis, unspecified: Secondary | ICD-10-CM

## 2019-07-28 ENCOUNTER — Ambulatory Visit (INDEPENDENT_AMBULATORY_CARE_PROVIDER_SITE_OTHER): Payer: Medicare PPO

## 2019-07-28 ENCOUNTER — Other Ambulatory Visit: Payer: Self-pay

## 2019-07-28 DIAGNOSIS — J309 Allergic rhinitis, unspecified: Secondary | ICD-10-CM

## 2019-08-09 ENCOUNTER — Encounter: Payer: Self-pay | Admitting: Physician Assistant

## 2019-08-09 ENCOUNTER — Other Ambulatory Visit: Payer: Self-pay

## 2019-08-09 ENCOUNTER — Ambulatory Visit: Payer: Medicare PPO | Admitting: Physician Assistant

## 2019-08-09 DIAGNOSIS — L2089 Other atopic dermatitis: Secondary | ICD-10-CM

## 2019-08-09 DIAGNOSIS — L503 Dermatographic urticaria: Secondary | ICD-10-CM

## 2019-08-09 NOTE — Patient Instructions (Signed)
Atopic Dermatitis  "Dermatitis" means inflammation of the skin.  "Atopic" dermatitis is a particular type of skin inflammation that is marked by dryness, associated itching, and a characteristic pattern of rash on the body.  The condition is fairly common and may occur in as many as 10% of children.  You will often hear it called "atopic eczema" or sometimes just "eczema".  The exact cause of atopic dermatitis is unknown.  In many patients, there is a family history of hay fever, asthma, or atopic dermatitis itself.  Rarely, atopic dermatitis in infants may be related to food sensitivity, such as sensitivity to milk, but this is often difficult to determine and manage.  In the majority of cases, however, no allergic triggers can be found.  Physical or emotional stressors (severe seasonal allergies, physical illness, etc.) can worsen atopic dermatitis.  Atopic dermatitis usually starts in infancy from the ages of 2 to 6 months.  The skin is dry and the rash is quite itchy, so infants may be restless and rub against the sheets or scratch (if able).  The rash may involve the face or it may cover a large part of the body.  As the child gets older, the rash may become more localized.  In early childhood, the rash is commonly on the legs, feet, hands and arms.  As a child becomes older, the rash may be limited to the bend of the elbows, knees, on the back of the hands, feet, and on the neck and face.  When the rash becomes more established, the dry itchy skin may become thickened, leathery and sometimes darker in coloration.  The more the person scratches, the worse the rash is and the thicker the skin gets.  Many children with atopic dermatitis outgrow the condition before school age, while others continue to have problems into adolescence and adulthood.  Many things may affect the severity of the condition.  All patients have sensitive and dry skin.  Many will find that during the winter months when the humidity  is very low, the dryness and itchiness will be worse.  On the other hand, some people are easily irritated by sweat and will find that they have more problems during the summer months.  Most patients note an increase in itching at times when there are sudden changes in temperature.  Other irritants easily affect the skin of a patient with atopic dermatitis.  Use of harsh soaps or detergents and exposure to wool are common problems.  Sometimes atopic dermatitis may become infected by bacteria, yeast or viruses.  This is called "secondary infection".  Bacterial secondary infection is the most common and is often a result of scratching.  The rash gets very red with pus-filled pimples and scabs.  If this occurs, your doctor will prescribe an antibiotic to control the infection.  A more serious complication can be caused by certain viruses.  The "cold sore" virus (herpes simplex) may cause a severe rash.  If this is suspected, immediately contact your doctor.   What can I expect from treatment? Unfortunately, there is no "magic" cure that will always eliminate atopic dermatitis.  The main objective in treating atopic dermatitis is to decrease the skin eruption and relieve the itching.  There are a number of different forms of the medications that are used for atopic dermatitis.  Primarily, topical medications will be used.  Because the skin is excessively dry, moisturizers will be recommended that will effectively decrease the dryness.  Daily bathing is  a useful way to get water into the skin but bathing should be brief (no more than 10 minutes unless otherwise indicated by your physician). ° °Effective moisturizers (Cetaphil cream or lotion, CeraVe cream or lotion [[employer], [employer], and [employer]], Aquaphor, and plain Vaseline) can be used immediately after the bath or shower to trap moisture within the skin.  It is best to “pat dry” after a bathing and then place your moisturizer (cream or lotion) on your skin.   Cortisone (steroid) is a medicated ointment or cream (eg. triamcinolone, hydrocortisone, desonide, betamethasone, clobetasol) that may also be suggested.  It is very helpful in decreasing the itching and controlling the inflammation.  Your doctor will prescribe a cortisone treatment that is most appropriate for the severity and location of the dermatitis that is to be treated.  ° °Once the affected area clears up, it is best to discontinue the use of the cortisone preparation due to possibility of atrophy (skin thinning), but continue the regular use of moisturizers to try to prevent new areas of dermatitis from occurring.  Of course, if itching or a new rash begins, the cortisone preparation may have to be started again.  Anti-inflammatory creams and ointments which are not steroids such as Protopic and Elidel may also be prescribed. ° °Certain internal medicines called antihistamines (eg. Atarax, Benadryl, hydroxyzine) may help control itching.  They primarily help with the itching by introducing some drowsiness and allowing you to sleep at night.  Some oral antibiotics are often useful as well for controlling the secondary infection and enable infected dermatitis to be controlled. ° °Other important forms of treatment: °1. Avoid contact with substances you know to cause itching.  These may include soaps, detergents, certain perfumes, dust, grass, weeds, wools, and other types of scratchy clothing. °2. You may bathe daily.  Use no soap or the minimal amount necessary to get clean.  Always use moisturizer immediately after bathing (within 3 minutes is best).  Avoid very hot or very cold water.  Avoid bubble baths.  When drying with a towel, pat dry and do not rub. Use a mild, unscented soap (Dove, CeraVe Cleanser, Lever 2000, or Cetaphil). °3. Try to keep the temperature and humidity in the home fairly constant.  Use a bedroom air conditioner in the summer and a humidifier in the winter.  It is very important that  the humidifier be cleaned frequently and thoroughly since mold may grow and cause allergies. °4. Try to avoid scratching.  Atopic dermatitis is often called “the itch that rashes” and it is known that scratching plays a significant role in making atopic dermatitis worse.  Keeping the nails short and well-filed is helpful. °5. Use a fragrance-free, sensitive skin laundry detergent (eg. All Free & Clear).  Run clothes through a second rinse cycle to remove any residual detergents and chemicals.  Bed linens and towels should be washed in hot water to kill dust mites, which are common allergen in atopic patients. °6. In the bedroom, minimize rugs and curtains or other loose fabrics that collect dust. ° °The National Eczema Association (www.eczema-assn.org) is a wonderful organization that sends out a quarterly newsletter with useful information on these types of conditions. Please consider contacting them at the above website or by address: [employer], [address], [address], [place], [address]  ° ° °

## 2019-08-09 NOTE — Progress Notes (Addendum)
   Follow-Up Visit   Subjective  Kathryn Davis is a 69 y.o. female who presents for the following: Rash (neck & chest- red and inflammed - comes and goes- tx- triamcinolone cream ). She has asthma and allergies and hand eczema. Pictures showed very red whelps. Come and go. Much better today. Takes allergy shots.   The following portions of the chart were reviewed this encounter and updated as appropriate: Tobacco  Allergies  Meds  Problems  Med Hx  Surg Hx  Fam Hx  Soc Hx      Objective  Well appearing patient in no apparent distress; mood and affect are within normal limits. Positive dermatographism  A full examination was performed including scalp, head, eyes, ears, nose, lips, neck, chest, axillae, abdomen, back, buttocks, bilateral upper extremities, bilateral lower extremities, hands, feet, fingers, toes, fingernails, and toenails. All findings within normal limits unless otherwise noted below.  No significant skin presentation. Positive dermatographism. No whelps today or rash. Small pink crusts on hands- interdigital.  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

## 2019-08-11 ENCOUNTER — Ambulatory Visit (INDEPENDENT_AMBULATORY_CARE_PROVIDER_SITE_OTHER): Payer: Medicare PPO

## 2019-08-11 ENCOUNTER — Other Ambulatory Visit: Payer: Self-pay

## 2019-08-11 DIAGNOSIS — J309 Allergic rhinitis, unspecified: Secondary | ICD-10-CM | POA: Diagnosis not present

## 2019-08-18 ENCOUNTER — Ambulatory Visit (INDEPENDENT_AMBULATORY_CARE_PROVIDER_SITE_OTHER): Payer: Medicare PPO

## 2019-08-18 ENCOUNTER — Other Ambulatory Visit: Payer: Self-pay

## 2019-08-18 DIAGNOSIS — J309 Allergic rhinitis, unspecified: Secondary | ICD-10-CM

## 2019-08-30 ENCOUNTER — Other Ambulatory Visit: Payer: Self-pay

## 2019-08-30 ENCOUNTER — Ambulatory Visit (INDEPENDENT_AMBULATORY_CARE_PROVIDER_SITE_OTHER): Payer: Medicare PPO

## 2019-08-30 DIAGNOSIS — J309 Allergic rhinitis, unspecified: Secondary | ICD-10-CM

## 2019-09-08 ENCOUNTER — Other Ambulatory Visit: Payer: Self-pay

## 2019-09-08 ENCOUNTER — Ambulatory Visit (INDEPENDENT_AMBULATORY_CARE_PROVIDER_SITE_OTHER): Payer: Medicare PPO

## 2019-09-08 DIAGNOSIS — J309 Allergic rhinitis, unspecified: Secondary | ICD-10-CM

## 2019-09-13 ENCOUNTER — Ambulatory Visit (INDEPENDENT_AMBULATORY_CARE_PROVIDER_SITE_OTHER): Payer: Medicare PPO

## 2019-09-13 ENCOUNTER — Other Ambulatory Visit: Payer: Self-pay

## 2019-09-13 DIAGNOSIS — J309 Allergic rhinitis, unspecified: Secondary | ICD-10-CM

## 2019-09-20 ENCOUNTER — Other Ambulatory Visit: Payer: Self-pay

## 2019-09-20 ENCOUNTER — Ambulatory Visit (INDEPENDENT_AMBULATORY_CARE_PROVIDER_SITE_OTHER): Payer: Medicare PPO

## 2019-09-20 DIAGNOSIS — J309 Allergic rhinitis, unspecified: Secondary | ICD-10-CM

## 2019-09-22 DIAGNOSIS — J3089 Other allergic rhinitis: Secondary | ICD-10-CM | POA: Diagnosis not present

## 2019-09-22 NOTE — Progress Notes (Signed)
EXP 09/21/20

## 2019-09-29 ENCOUNTER — Ambulatory Visit (INDEPENDENT_AMBULATORY_CARE_PROVIDER_SITE_OTHER): Payer: Medicare PPO

## 2019-09-29 ENCOUNTER — Other Ambulatory Visit: Payer: Self-pay

## 2019-09-29 DIAGNOSIS — J309 Allergic rhinitis, unspecified: Secondary | ICD-10-CM | POA: Diagnosis not present

## 2019-10-03 DIAGNOSIS — J301 Allergic rhinitis due to pollen: Secondary | ICD-10-CM | POA: Diagnosis not present

## 2019-10-18 ENCOUNTER — Ambulatory Visit (INDEPENDENT_AMBULATORY_CARE_PROVIDER_SITE_OTHER): Payer: Medicare PPO

## 2019-10-18 ENCOUNTER — Other Ambulatory Visit: Payer: Self-pay

## 2019-10-18 DIAGNOSIS — J309 Allergic rhinitis, unspecified: Secondary | ICD-10-CM | POA: Diagnosis not present

## 2019-10-27 ENCOUNTER — Ambulatory Visit (INDEPENDENT_AMBULATORY_CARE_PROVIDER_SITE_OTHER): Payer: Medicare PPO

## 2019-10-27 ENCOUNTER — Other Ambulatory Visit: Payer: Self-pay

## 2019-10-27 DIAGNOSIS — J309 Allergic rhinitis, unspecified: Secondary | ICD-10-CM

## 2019-11-08 ENCOUNTER — Other Ambulatory Visit: Payer: Self-pay

## 2019-11-08 ENCOUNTER — Ambulatory Visit (INDEPENDENT_AMBULATORY_CARE_PROVIDER_SITE_OTHER): Payer: Medicare PPO

## 2019-11-08 DIAGNOSIS — J309 Allergic rhinitis, unspecified: Secondary | ICD-10-CM

## 2019-11-09 ENCOUNTER — Encounter: Payer: Self-pay | Admitting: Obstetrics & Gynecology

## 2019-11-09 ENCOUNTER — Ambulatory Visit: Payer: Medicare PPO | Admitting: Obstetrics & Gynecology

## 2019-11-09 ENCOUNTER — Other Ambulatory Visit: Payer: Self-pay

## 2019-11-09 VITALS — BP 140/80

## 2019-11-09 DIAGNOSIS — N819 Female genital prolapse, unspecified: Secondary | ICD-10-CM | POA: Diagnosis not present

## 2019-11-09 DIAGNOSIS — Z4689 Encounter for fitting and adjustment of other specified devices: Secondary | ICD-10-CM

## 2019-11-09 NOTE — Progress Notes (Signed)
    Kathryn Davis 02/28/1951 [id number]        69 y.o.  G2P2002   RP: Pessary maintenance  HPI: Well with Milex ring #4 with support.  No abnormal vaginal discharge.  No vaginal bleeding.  No pelvic or vaginal pain.  Urine and bowel movements normal.  No fever.   OB History  Gravida Para Term Preterm AB Living  2 2 2     2   SAB TAB Ectopic Multiple Live Births               # Outcome Date GA Lbr Len/2nd Weight Sex Delivery Anes PTL Lv  2 Term           1 Term             Past medical history,surgical history, problem list, medications, allergies, family history and social history were all reviewed and documented in the EPIC chart.   Directed ROS with pertinent positives and negatives documented in the history of present illness/assessment and plan.  Exam:  Vitals:   11/09/19 1444  BP: (!) 140/80   General appearance:  Normal  Abdomen: Normal  Gynecologic exam: Vulva normal.  Pessary easily removed and cleaned.  Vaginal mucosa intact.  Pessary put back in place easily.   Assessment/Plan:  69 y.o. G2P2002   1. Encounter for pessary maintenance Patient is satisfied with Milex ring #4 with support.  No complication of the pessary.  We will continue the same.  Follow-up in 4 months.  [name] MD, 3:17 PM 11/09/2019

## 2019-11-12 ENCOUNTER — Encounter: Payer: Self-pay | Admitting: Obstetrics & Gynecology

## 2019-11-15 ENCOUNTER — Ambulatory Visit (INDEPENDENT_AMBULATORY_CARE_PROVIDER_SITE_OTHER): Payer: Medicare PPO

## 2019-11-15 ENCOUNTER — Other Ambulatory Visit: Payer: Self-pay

## 2019-11-15 DIAGNOSIS — J309 Allergic rhinitis, unspecified: Secondary | ICD-10-CM | POA: Diagnosis not present

## 2019-11-29 ENCOUNTER — Other Ambulatory Visit: Payer: Self-pay

## 2019-11-29 ENCOUNTER — Ambulatory Visit (INDEPENDENT_AMBULATORY_CARE_PROVIDER_SITE_OTHER): Payer: Medicare PPO

## 2019-11-29 DIAGNOSIS — J309 Allergic rhinitis, unspecified: Secondary | ICD-10-CM

## 2019-12-06 ENCOUNTER — Ambulatory Visit (INDEPENDENT_AMBULATORY_CARE_PROVIDER_SITE_OTHER): Payer: Medicare PPO

## 2019-12-06 ENCOUNTER — Other Ambulatory Visit: Payer: Self-pay

## 2019-12-06 DIAGNOSIS — J309 Allergic rhinitis, unspecified: Secondary | ICD-10-CM

## 2019-12-13 ENCOUNTER — Other Ambulatory Visit: Payer: Self-pay

## 2019-12-13 ENCOUNTER — Ambulatory Visit (INDEPENDENT_AMBULATORY_CARE_PROVIDER_SITE_OTHER): Payer: Medicare PPO

## 2019-12-13 DIAGNOSIS — J309 Allergic rhinitis, unspecified: Secondary | ICD-10-CM

## 2019-12-20 ENCOUNTER — Ambulatory Visit (INDEPENDENT_AMBULATORY_CARE_PROVIDER_SITE_OTHER): Payer: Medicare PPO

## 2019-12-20 ENCOUNTER — Other Ambulatory Visit: Payer: Self-pay

## 2019-12-20 DIAGNOSIS — J309 Allergic rhinitis, unspecified: Secondary | ICD-10-CM | POA: Diagnosis not present

## 2019-12-30 ENCOUNTER — Other Ambulatory Visit: Payer: Self-pay

## 2019-12-30 ENCOUNTER — Encounter: Payer: Self-pay | Admitting: Allergy

## 2019-12-30 ENCOUNTER — Ambulatory Visit: Payer: Medicare PPO | Admitting: Allergy

## 2019-12-30 VITALS — BP 146/78 | HR 66 | Resp 16 | Ht 63.5 in | Wt 177.6 lb

## 2019-12-30 DIAGNOSIS — J3089 Other allergic rhinitis: Secondary | ICD-10-CM

## 2019-12-30 DIAGNOSIS — J4520 Mild intermittent asthma, uncomplicated: Secondary | ICD-10-CM | POA: Diagnosis not present

## 2019-12-30 DIAGNOSIS — H1013 Acute atopic conjunctivitis, bilateral: Secondary | ICD-10-CM

## 2019-12-30 DIAGNOSIS — T7800XD Anaphylactic reaction due to unspecified food, subsequent encounter: Secondary | ICD-10-CM

## 2019-12-30 NOTE — Patient Instructions (Addendum)
Allergic rhinitis with conjunctivitis    -continue avoidance measures to grass, weed, tree, molds, dust mites and cat    - continue allergen immunotherapy per schedule.      -For nasal drainage can use Astelin nasal spray 2 sprays each nostril 2 times a day as needed    -continue daily Allegra 180mg  as needed   Allergic asthma   -At this time doing well   -Allergen driven   -have access to albuterol inhaler 2 puffs every 4-6 hours as needed for cough/wheeze/shortness of breath/chest tightness.  May use 15-20 minutes prior to activity.   Monitor frequency of use.     - continue Qvar 93mcg 1 puff twice a day.      - Action Plan: During respiratory illnesses/flares may use Qvar 3 puffs 3 times a day for 1-2 weeks and go back to regular dosing once symptoms improve.    - if still under good control at next visit in 6 months then will plan to try further wean to Qvar 24mcg dose.   Control goals:   Full participation in all desired activities (may need albuterol before activity)  Albuterol use two time or less a week on average (not counting use with activity)  Cough interfering with sleep two time or less a month  Oral steroids no more than once a year  No hospitalizations  Shellfish allergy   -Continue avoidance of mollusks (clams, scallops, oysters)   -Have access to EpiPen as above in case of allergic reaction   -Follow emergency action plan in case of allergic reaction  - ok from my standpoint to use glucosamine not containing shellfish  Follow-up 6 months or sooner if needed

## 2019-12-30 NOTE — Progress Notes (Signed)
Follow-up Note  RE: FRANCENA ZENDER MRN: [id number] DOB: 1950/05/23 Date of Office Visit: 12/30/2019   History of present illness: Kathryn Davis is a 69 y.o. female presenting today for follow-up of allergic rhinitis with conjunctivitis, asthma and shellfish allergy.  She was last seen in the office on 06/30/2019 by myself.  She states she has been doing well.  She was able to wean down her Qvar 80 mcg to 1 puff twice a day and has not needed to use albuterol.  She does note sometimes that she has a slight symptoms of when she is due for her next dose in the evenings.  She is not requiring any ED or urgent care visits or systemic steroid needs. She has not had any increase in nasal or ocular allergy symptoms.  She is not needing to take Allegra on a daily basis.  She continues on allergen immunotherapy and is tolerating them well without any large local or systemic reactions. She continues to avoid shellfish without any accidental ingestions or need to use her epinephrine device. She is wondering if she have would be able to take glucosamine without shellfish added to help with her joints.  Review of systems: Review of Systems  Constitutional: Negative.   HENT: Negative.   Eyes: Negative.   Respiratory: Negative.   Cardiovascular: Negative.   Gastrointestinal: Negative.   Musculoskeletal: Negative.   Skin: Negative.   Neurological: Negative.     All other systems negative unless noted above in HPI  Past medical/social/surgical/family history have been reviewed and are unchanged unless specifically indicated below.  No changes  Medication List: Current Outpatient Medications  Medication Sig Dispense Refill  . albuterol (PROAIR HFA) 108 (90 Base) MCG/ACT inhaler Inhale 2 puffs into the lungs every 4 (four) hours as needed for wheezing or shortness of breath. 18 g 1  . atorvastatin (LIPITOR) 10 MG tablet Take 10 mg by mouth daily.  1  . azelastine (ASTELIN) 0.1 %  nasal spray Place 2 sprays into both nostrils 2 (two) times daily. 30 mL 5  . beclomethasone (QVAR REDIHALER) 80 MCG/ACT inhaler Inhale 2 puffs into the lungs 2 (two) times daily. 31.8 g 1  . citalopram (CELEXA) 10 MG tablet Take 10 mg by mouth daily.    Marland Kitchen EPINEPHrine (EPIPEN 2-PAK) 0.3 mg/0.3 mL IJ SOAJ injection Use as directed for severe allergic reaction 2 Device 1  . Multiple Vitamin (MULTIVITAMIN PO) Take 1 tablet by mouth daily.      No current facility-administered medications for this visit.     Known medication allergies: Allergies  Allergen Reactions  . Shellfish Allergy Nausea And Vomiting     Physical examination: Blood pressure (!) 146/78, pulse 66, resp. rate 16, height 5' 3.5" (1.613 m), weight 177 lb 9.6 oz (80.6 kg), SpO2 96 %.  General: Alert, interactive, in no acute distress. HEENT: PERRLA, TMs pearly gray Neck: Supple without lymphadenopathy. Lungs: Clear to auscultation without wheezing, rhonchi or rales. {no increased work of breathing. CV: Normal S1, S2 without murmurs. Abdomen: Nondistended, nontender. Skin: Warm and dry, without lesions or rashes. Extremities:  No clubbing, cyanosis or edema. Neuro:   Grossly intact.  Diagnositics/Labs: None today  Assessment and plan:   Allergic rhinitis with conjunctivitis    -continue avoidance measures to grass, weed, tree, molds, dust mites and cat    - continue allergen immunotherapy per schedule.      -For nasal drainage can use Astelin nasal spray 2 sprays each nostril  2 times a day as needed    -continue daily Allegra 180mg  as needed   Allergic asthma   -At this time doing well   -Allergen driven   -have access to albuterol inhaler 2 puffs every 4-6 hours as needed for cough/wheeze/shortness of breath/chest tightness.  May use 15-20 minutes prior to activity.   Monitor frequency of use.     - continue Qvar 51mcg 1 puff twice a day.      - Action Plan: During respiratory illnesses/flares may use Qvar 3  puffs 3 times a day for 1-2 weeks and go back to regular dosing once symptoms improve.    - if still under good control at next visit in 6 months then will plan to try further wean to Qvar 58mcg dose.   Control goals:   Full participation in all desired activities (may need albuterol before activity)  Albuterol use two time or less a week on average (not counting use with activity)  Cough interfering with sleep two time or less a month  Oral steroids no more than once a year  No hospitalizations  Shellfish allergy   -Continue avoidance of mollusks (clams, scallops, oysters)   -Have access to EpiPen as above in case of allergic reaction   -Follow emergency action plan in case of allergic reaction   - ok from my standpoint to use glucosamine not containing shellfish  Follow-up 6 months or sooner if needed  I appreciate the opportunity to take part in [place] care. Please do not hesitate to contact me with questions.  Sincerely,   Prudy Feeler, MD Allergy/Immunology [facility]

## 2020-01-03 ENCOUNTER — Other Ambulatory Visit: Payer: Self-pay

## 2020-01-03 ENCOUNTER — Ambulatory Visit (INDEPENDENT_AMBULATORY_CARE_PROVIDER_SITE_OTHER): Payer: Medicare PPO

## 2020-01-03 DIAGNOSIS — J309 Allergic rhinitis, unspecified: Secondary | ICD-10-CM

## 2020-01-05 DIAGNOSIS — M25569 Pain in unspecified knee: Secondary | ICD-10-CM | POA: Diagnosis not present

## 2020-01-05 DIAGNOSIS — Z23 Encounter for immunization: Secondary | ICD-10-CM | POA: Diagnosis not present

## 2020-01-05 DIAGNOSIS — E782 Mixed hyperlipidemia: Secondary | ICD-10-CM | POA: Diagnosis not present

## 2020-01-05 DIAGNOSIS — M25559 Pain in unspecified hip: Secondary | ICD-10-CM | POA: Diagnosis not present

## 2020-01-05 DIAGNOSIS — R7303 Prediabetes: Secondary | ICD-10-CM | POA: Diagnosis not present

## 2020-01-10 ENCOUNTER — Other Ambulatory Visit: Payer: Self-pay

## 2020-01-10 ENCOUNTER — Ambulatory Visit (INDEPENDENT_AMBULATORY_CARE_PROVIDER_SITE_OTHER): Payer: Medicare PPO

## 2020-01-10 DIAGNOSIS — J309 Allergic rhinitis, unspecified: Secondary | ICD-10-CM | POA: Diagnosis not present

## 2020-01-17 DIAGNOSIS — M1711 Unilateral primary osteoarthritis, right knee: Secondary | ICD-10-CM | POA: Diagnosis not present

## 2020-01-17 DIAGNOSIS — M25551 Pain in right hip: Secondary | ICD-10-CM | POA: Diagnosis not present

## 2020-01-26 ENCOUNTER — Ambulatory Visit (INDEPENDENT_AMBULATORY_CARE_PROVIDER_SITE_OTHER): Payer: Medicare PPO

## 2020-01-26 ENCOUNTER — Other Ambulatory Visit: Payer: Self-pay

## 2020-01-26 DIAGNOSIS — J309 Allergic rhinitis, unspecified: Secondary | ICD-10-CM | POA: Diagnosis not present

## 2020-01-26 NOTE — Progress Notes (Signed)
VIALS EXP 01-25-21 

## 2020-01-27 DIAGNOSIS — J3089 Other allergic rhinitis: Secondary | ICD-10-CM | POA: Diagnosis not present

## 2020-01-30 DIAGNOSIS — J301 Allergic rhinitis due to pollen: Secondary | ICD-10-CM | POA: Diagnosis not present

## 2020-01-31 ENCOUNTER — Other Ambulatory Visit: Payer: Self-pay

## 2020-01-31 ENCOUNTER — Ambulatory Visit (INDEPENDENT_AMBULATORY_CARE_PROVIDER_SITE_OTHER): Payer: Medicare PPO

## 2020-01-31 DIAGNOSIS — J309 Allergic rhinitis, unspecified: Secondary | ICD-10-CM | POA: Diagnosis not present

## 2020-01-31 DIAGNOSIS — M25551 Pain in right hip: Secondary | ICD-10-CM | POA: Diagnosis not present

## 2020-02-07 DIAGNOSIS — M25551 Pain in right hip: Secondary | ICD-10-CM | POA: Diagnosis not present

## 2020-02-10 DIAGNOSIS — M25551 Pain in right hip: Secondary | ICD-10-CM | POA: Diagnosis not present

## 2020-02-14 DIAGNOSIS — M25551 Pain in right hip: Secondary | ICD-10-CM | POA: Diagnosis not present

## 2020-02-15 DIAGNOSIS — M25551 Pain in right hip: Secondary | ICD-10-CM | POA: Diagnosis not present

## 2020-02-16 ENCOUNTER — Ambulatory Visit: Payer: Medicare PPO

## 2020-02-16 ENCOUNTER — Other Ambulatory Visit: Payer: Self-pay

## 2020-02-17 DIAGNOSIS — M25551 Pain in right hip: Secondary | ICD-10-CM | POA: Diagnosis not present

## 2020-02-21 DIAGNOSIS — M25551 Pain in right hip: Secondary | ICD-10-CM | POA: Diagnosis not present

## 2020-02-22 ENCOUNTER — Ambulatory Visit: Payer: Medicare PPO | Admitting: Obstetrics & Gynecology

## 2020-02-22 ENCOUNTER — Encounter: Payer: Self-pay | Admitting: Obstetrics & Gynecology

## 2020-02-22 ENCOUNTER — Other Ambulatory Visit: Payer: Self-pay

## 2020-02-22 VITALS — BP 128/76

## 2020-02-22 DIAGNOSIS — N819 Female genital prolapse, unspecified: Secondary | ICD-10-CM | POA: Diagnosis not present

## 2020-02-22 DIAGNOSIS — Z4689 Encounter for fitting and adjustment of other specified devices: Secondary | ICD-10-CM

## 2020-02-22 NOTE — Progress Notes (Signed)
    Kathryn Davis Dec 07, 1950 [id number]        69 y.o.  G2P2L2   RP: Pessary maintenance  HPI: Well on pessary, with Milex ring #4 with support.  No vaginal bleeding.  No d/c.  No pain.  Urine/BMs normal.   OB History  Gravida Para Term Preterm AB Living  2 2 2     2   SAB TAB Ectopic Multiple Live Births               # Outcome Date GA Lbr Len/2nd Weight Sex Delivery Anes PTL Lv  2 Term           1 Term             Past medical history,surgical history, problem list, medications, allergies, family history and social history were all reviewed and documented in the EPIC chart.   Directed ROS with pertinent positives and negatives documented in the history of present illness/assessment and plan.  Exam:  Vitals:   02/22/20 1113  BP: 128/76   General appearance:  Normal  Abdomen: Normal  Gynecologic exam: Vulva normal.  Pessary removed and cleaned.  Vaginal mucosa intact.  Pessary easily put back in place.   Assessment/Plan:  69 y.o. G2P2002   1. Encounter for pessary maintenance Well with Milex ring #4 with support.  Vaginal mucosa intact.  Symptoms well controlled.  Patient will follow up in 4 months for maintenance.  [name] MD, 11:39 AM 02/22/2020

## 2020-02-23 ENCOUNTER — Ambulatory Visit (INDEPENDENT_AMBULATORY_CARE_PROVIDER_SITE_OTHER): Payer: Medicare PPO

## 2020-02-23 DIAGNOSIS — J309 Allergic rhinitis, unspecified: Secondary | ICD-10-CM | POA: Diagnosis not present

## 2020-02-23 DIAGNOSIS — M1711 Unilateral primary osteoarthritis, right knee: Secondary | ICD-10-CM | POA: Insufficient documentation

## 2020-02-23 DIAGNOSIS — M1611 Unilateral primary osteoarthritis, right hip: Secondary | ICD-10-CM | POA: Insufficient documentation

## 2020-02-24 DIAGNOSIS — M25551 Pain in right hip: Secondary | ICD-10-CM | POA: Diagnosis not present

## 2020-02-26 ENCOUNTER — Encounter: Payer: Self-pay | Admitting: Obstetrics & Gynecology

## 2020-02-28 DIAGNOSIS — M1611 Unilateral primary osteoarthritis, right hip: Secondary | ICD-10-CM | POA: Diagnosis not present

## 2020-02-28 DIAGNOSIS — M1711 Unilateral primary osteoarthritis, right knee: Secondary | ICD-10-CM | POA: Diagnosis not present

## 2020-03-02 DIAGNOSIS — M25551 Pain in right hip: Secondary | ICD-10-CM | POA: Diagnosis not present

## 2020-03-06 ENCOUNTER — Other Ambulatory Visit: Payer: Self-pay

## 2020-03-06 ENCOUNTER — Ambulatory Visit (INDEPENDENT_AMBULATORY_CARE_PROVIDER_SITE_OTHER): Payer: Medicare PPO

## 2020-03-06 DIAGNOSIS — J309 Allergic rhinitis, unspecified: Secondary | ICD-10-CM | POA: Diagnosis not present

## 2020-03-06 DIAGNOSIS — M25551 Pain in right hip: Secondary | ICD-10-CM | POA: Diagnosis not present

## 2020-03-10 DIAGNOSIS — J22 Unspecified acute lower respiratory infection: Secondary | ICD-10-CM | POA: Diagnosis not present

## 2020-03-10 DIAGNOSIS — J45909 Unspecified asthma, uncomplicated: Secondary | ICD-10-CM | POA: Diagnosis not present

## 2020-03-13 ENCOUNTER — Ambulatory Visit (INDEPENDENT_AMBULATORY_CARE_PROVIDER_SITE_OTHER): Payer: Medicare PPO | Admitting: Family Medicine

## 2020-03-13 ENCOUNTER — Other Ambulatory Visit: Payer: Self-pay

## 2020-03-13 ENCOUNTER — Encounter: Payer: Self-pay | Admitting: Family Medicine

## 2020-03-13 DIAGNOSIS — T7800XD Anaphylactic reaction due to unspecified food, subsequent encounter: Secondary | ICD-10-CM

## 2020-03-13 DIAGNOSIS — J302 Other seasonal allergic rhinitis: Secondary | ICD-10-CM | POA: Diagnosis not present

## 2020-03-13 DIAGNOSIS — H1013 Acute atopic conjunctivitis, bilateral: Secondary | ICD-10-CM | POA: Diagnosis not present

## 2020-03-13 DIAGNOSIS — Z9189 Other specified personal risk factors, not elsewhere classified: Secondary | ICD-10-CM | POA: Diagnosis not present

## 2020-03-13 DIAGNOSIS — J4540 Moderate persistent asthma, uncomplicated: Secondary | ICD-10-CM | POA: Diagnosis not present

## 2020-03-13 DIAGNOSIS — J3089 Other allergic rhinitis: Secondary | ICD-10-CM

## 2020-03-13 MED ORDER — BUDESONIDE-FORMOTEROL FUMARATE 160-4.5 MCG/ACT IN AERO: INHALATION_SPRAY | RESPIRATORY_TRACT | 5 refills | Status: DC

## 2020-03-13 MED ORDER — SPACER/AERO-HOLDING CHAMBERS DEVI: 1 refills | Status: AC

## 2020-03-13 NOTE — Patient Instructions (Signed)
Asthma Stop Qvar and begin Symbicort 160-2 puffs twice a day with a spacer to prevent cough or wheeze You may increase albuterol to 2 puffs once every 4 hours as needed for cough or wheeze You may use albuterol 2 puffs 5-15 minutes before activity to decrease cough or wheeze If your symptoms worsen or do not improve, will move forward with chest xray.  Allergic rhinitis Consider saline nasal rinses as needed for nasal symptoms. Use this before any medicated nasal sprays for best result Begin Flonase 2 sprays in each nostril once a day for a stuffy nose.  In the right nostril, point the applicator out toward the right ear. In the left nostril, point the applicator out toward the left ear For thick post nasal drainage, begin Mucinex [phone] mg twice a day and increase hydration in order to thin down mucus. Continue allergen avoidance measures directed toward pollen, pets, molds, and dust mites as listed below  Allergic conjunctivitis Some over the counter eye drops include Pataday one drop in each eye once a day as needed for red, itchy eyes OR Zaditor one drop in each eye twice a day as needed for red itchy eyes.  Food allergy Continue to avoid shellfish.  In case of an allergic reaction, take Benadryl 50 mg every 4 hours, and if life-threatening symptoms occur, inject with EpiPen 0.3 mg.  COVID19 Get a COVID test as soon as possible and isolate until the result becomes available.  Call the clinic if this treatment plan is not working well for you  Follow up in 2 months or sooner if needed.

## 2020-03-13 NOTE — Progress Notes (Addendum)
RE: Kathryn Davis MRN: [id number] DOB: 1950/05/03 Date of Telemedicine Visit: 03/13/2020  Referring provider: Leighton Ruff, MD Primary care provider: Leighton Ruff, MD  Chief Complaint: Nasal Congestion (alot of issues with asthma, she is not getting better with the current meds. Last wednesday had a slight cough. Her albuterol is expired. Saturday went to walk in clinic and got some albuterol. Now she is doing albuterol, daily inhaler and azelastine. when laying in bed she sounds like a kitten purring, she is coughing alot also. nasal fluid is yellow.)   Telemedicine Follow Up Visit via Telephone: I connected with Kathryn Davis for a follow up on 03/13/20 by telephone and verified that I am speaking with the correct person using two identifiers.   I discussed the limitations, risks, security and privacy concerns of performing an evaluation and management service by telephone and the availability of in person appointments. I also discussed with the patient that there may be a patient responsible charge related to this service. The patient expressed understanding and agreed to proceed.  Patient is at home  Provider is at the office.  Visit start time: 1001 Visit end time: 41 Insurance consent/check in by: [place] consent and medical assistant/nurse: Sharyn Lull  History of Present Illness: She is a 69 y.o. female, who is being followed for asthma, allergic rhinitis on allergen immunotherapy, allergic conjunctivitis, and food allergy to shellfish. Her previous allergy office visit was on 12/30/2019 with Dr. Nelva Bush. At today's visit, she reports that on Wednesday, 6 days ago, she began to experience a dry cough. She reports that she increased Qvar 80 from 2 puffs once a day to 2 puffs twice a day at that time. On Saturday, she reports that she went to Urgent Care where she received albuterol and azelastine nasal spray. At today's visit, she reports that she is  experiencing shortness of breath and wheeze mainly while lying down for the last 2 nights. She reports the cough sounds "juicy", however, remains dry. She continues Qvar 80-2 puffs twice a day and is using albuterol 1 puff every 4-6 hours with moderate relief of symptoms. Allergic rhinitis is reported as poorly controlled with clear to yellow rhinorrhea and nasal congestion. She continues azelastine twice a day and is not currently taking an antihistamine. She is not using Flonase or nasal saline rinses at this time. She denies fever, sweats, and chills and reports that she frequently baby sits her grandchildren who attend school. She is not experiencing loss of taste or smell. She has received 2 COVID vaccines and a booster COVID vaccine. She denies symptoms of reflux including heartburn and vomiting. Her current medications are listed in the chart.   Assessment and Plan: Kathryn Davis is a 69 y.o. female with: Patient Instructions  Asthma Stop Qvar and begin Symbicort 160-2 puffs twice a day with a spacer to prevent cough or wheeze You may increase albuterol to 2 puffs once every 4 hours as needed for cough or wheeze You may use albuterol 2 puffs 5-15 minutes before activity to decrease cough or wheeze If your symptoms worsen or do not improve, will move forward with chest xray.  Allergic rhinitis Consider saline nasal rinses as needed for nasal symptoms. Use this before any medicated nasal sprays for best result Begin Flonase 2 sprays in each nostril once a day for a stuffy nose.  In the right nostril, point the applicator out toward the right ear. In the left nostril, point the applicator out toward the left ear  For thick post nasal drainage, begin Mucinex [phone] mg twice a day and increase hydration in order to thin down mucus. Continue allergen avoidance measures directed toward pollen, pets, molds, and dust mites as listed below  Allergic conjunctivitis Some over the counter eye drops include  Pataday one drop in each eye once a day as needed for red, itchy eyes OR Zaditor one drop in each eye twice a day as needed for red itchy eyes.  Food allergy Continue to avoid shellfish.  In case of an allergic reaction, take Benadryl 50 mg every 4 hours, and if life-threatening symptoms occur, inject with EpiPen 0.3 mg.  COVID19 Get a COVID test as soon as possible and isolate until the result becomes available.  Call the clinic if this treatment plan is not working well for you  Follow up in 2 months or sooner if needed.    Return in about 2 months (around 05/13/2020).  Meds ordered this encounter  Medications  . budesonide-formoterol (SYMBICORT) 160-4.5 MCG/ACT inhaler    Sig: 2 puffs twice daily with spacer to prevent coughing or wheezing.    Dispense:  1 each    Refill:  5  . Spacer/Aero-Holding Josiah Lobo DEVI    Sig: Use as directed.    Dispense:  1 each    Refill:  1    Medication List:  Current Outpatient Medications  Medication Sig Dispense Refill  . albuterol (PROAIR HFA) 108 (90 Base) MCG/ACT inhaler Inhale 2 puffs into the lungs every 4 (four) hours as needed for wheezing or shortness of breath. 18 g 1  . atorvastatin (LIPITOR) 10 MG tablet Take 10 mg by mouth daily.  1  . azelastine (ASTELIN) 0.1 % nasal spray Place 2 sprays into both nostrils 2 (two) times daily. 30 mL 5  . beclomethasone (QVAR REDIHALER) 80 MCG/ACT inhaler Inhale 2 puffs into the lungs 2 (two) times daily. 31.8 g 1  . citalopram (CELEXA) 10 MG tablet Take 10 mg by mouth daily.    Marland Kitchen EPINEPHrine (EPIPEN 2-PAK) 0.3 mg/0.3 mL IJ SOAJ injection Use as directed for severe allergic reaction 2 Device 1  . Multiple Vitamin (MULTIVITAMIN PO) Take 1 tablet by mouth daily.     . budesonide-formoterol (SYMBICORT) 160-4.5 MCG/ACT inhaler 2 puffs twice daily with spacer to prevent coughing or wheezing. 1 each 5  . Spacer/Aero-Holding [facility] Use as directed. 1 each 1   No current facility-administered  medications for this visit.   Allergies: Allergies  Allergen Reactions  . Shellfish Allergy Nausea And Vomiting   I reviewed her past medical history, social history, family history, and environmental history and no significant changes have been reported from previous visit on 12/30/2019.  Objective: Physical Exam Not obtained as encounter was done via telephone.   Previous notes and tests were reviewed.  I discussed the assessment and treatment plan with the patient. The patient was provided an opportunity to ask questions and all were answered. The patient agreed with the plan and demonstrated an understanding of the instructions.   The patient was advised to call back or seek an in-person evaluation if the symptoms worsen or if the condition fails to improve as anticipated.  I provided 30 minutes of non-face-to-face time during this encounter.  It was my pleasure to participate in [facility] care today. Please feel free to contact me with any questions or concerns.   Sincerely,  Gareth Morgan, FNP  ________________________________________________  I have provided oversight concerning [facility] evaluation and  treatment of this patient's health issues addressed during today's encounter.  I agree with the assessment and therapeutic plan as outlined in the note.   Signed,   R Edgar Frisk, MD

## 2020-03-16 ENCOUNTER — Ambulatory Visit
Admission: RE | Admit: 2020-03-16 | Discharge: 2020-03-16 | Disposition: A | Discharge status: Home / Self Care | Payer: Medicare PPO | Source: Ambulatory Visit | Attending: Family Medicine | Admitting: Family Medicine

## 2020-03-16 ENCOUNTER — Telehealth: Payer: Self-pay | Admitting: Family Medicine

## 2020-03-16 ENCOUNTER — Other Ambulatory Visit: Payer: Self-pay | Admitting: Family Medicine

## 2020-03-16 ENCOUNTER — Other Ambulatory Visit: Payer: Self-pay

## 2020-03-16 DIAGNOSIS — R053 Chronic cough: Secondary | ICD-10-CM

## 2020-03-16 DIAGNOSIS — R059 Cough, unspecified: Secondary | ICD-10-CM | POA: Diagnosis not present

## 2020-03-16 MED ORDER — OMEPRAZOLE 40 MG PO CPDR: 40.0000 mg | DELAYED_RELEASE_CAPSULE | Freq: Every day | ORAL | 5 refills | Status: DC

## 2020-03-16 NOTE — Telephone Encounter (Signed)
Please advise to call pt thank you

## 2020-03-16 NOTE — Progress Notes (Signed)
Patient aware of cxr result and agrees to follow the plan as listed above. She will call the clinic with any questions

## 2020-03-16 NOTE — Telephone Encounter (Signed)
Patient requested to speak to [facility] regarding her symptoms. Patient states the medications she is on cleared up the nasal congestion, but she is still wheezing which concerns her. Patient cannot sleep at night due to the wheezing.  Please advise.

## 2020-03-16 NOTE — Telephone Encounter (Signed)
Place xray order for state for cough r05 per [facility] verbal request

## 2020-03-16 NOTE — Progress Notes (Signed)
Can you please let this patient know her chest xray was clear.  Please have her add on Mucinex [phone] mg twice a day and increase hydration as tolerated. Please have her begin omeprazole 40 mg once a day to control reflux which may be contributing to wheeze. Continue Symbicort 160-2 puffs twice a day with a spacer and albuterol 2 puffs every 4 hours as needed. Continue nasal saline rinses followed by Flonase 2 sprays in each nostril once a day.  In the right nostril, point the applicator out toward the right ear. In the left nostril, point the applicator out toward the left ear. She may use azelastine 2 sprays in each nostril twice a day as needed for a runny nose. Have her call the clinic with any worsening symptoms or if she develops a fever. Thank you

## 2020-03-27 ENCOUNTER — Other Ambulatory Visit: Payer: Self-pay

## 2020-03-27 ENCOUNTER — Ambulatory Visit (INDEPENDENT_AMBULATORY_CARE_PROVIDER_SITE_OTHER): Payer: Medicare PPO

## 2020-03-27 DIAGNOSIS — J309 Allergic rhinitis, unspecified: Secondary | ICD-10-CM | POA: Diagnosis not present

## 2020-04-03 ENCOUNTER — Ambulatory Visit (INDEPENDENT_AMBULATORY_CARE_PROVIDER_SITE_OTHER): Payer: Medicare PPO

## 2020-04-03 ENCOUNTER — Other Ambulatory Visit: Payer: Self-pay

## 2020-04-03 DIAGNOSIS — J309 Allergic rhinitis, unspecified: Secondary | ICD-10-CM | POA: Diagnosis not present

## 2020-04-04 ENCOUNTER — Telehealth: Payer: Self-pay | Admitting: Allergy

## 2020-04-04 MED ORDER — ALBUTEROL SULFATE HFA 108 (90 BASE) MCG/ACT IN AERS: 2.0000 | INHALATION_SPRAY | RESPIRATORY_TRACT | 1 refills | Status: DC | PRN

## 2020-04-04 NOTE — Telephone Encounter (Signed)
Refills have been sent to the requested pharmacy. Patient has been made aware.

## 2020-04-04 NOTE — Telephone Encounter (Signed)
Patient is requesting a refill for her albuterol (ProAire HFA). [facility].

## 2020-04-11 ENCOUNTER — Telehealth: Payer: Self-pay | Admitting: Allergy

## 2020-04-11 NOTE — Telephone Encounter (Signed)
PT having sob and severe cough for last 6 weeks. Stating its much worse now due to low energy levels. A month ago before the last appt, she felt better. Taking sybmicort, albuterol, flonase, azelastine, mucinex and lots of water.

## 2020-04-11 NOTE — Telephone Encounter (Signed)
Patient stated that for the last 6 weeks she has been experiencing shortness of breath. She stated that it is getting worse and is wondering what else she can do. Please advise.

## 2020-04-11 NOTE — Telephone Encounter (Signed)
Can you please have her make an appointment in the clinic so we can listen to her and do spirometry? Her last visit was televisit followed by a clear cxr, however, the interventions were not effective at that time. Thank you

## 2020-04-12 DIAGNOSIS — Z20822 Contact with and (suspected) exposure to covid-19: Secondary | ICD-10-CM | POA: Diagnosis not present

## 2020-04-12 DIAGNOSIS — R059 Cough, unspecified: Secondary | ICD-10-CM | POA: Diagnosis not present

## 2020-04-12 NOTE — Telephone Encounter (Signed)
Spoke with patient, she stated that she had a televisit with her primary care physician. She was sent for a Covid testing which was negative. She stated her primary care physician started her on a Zpack. Patient stated that she took 2 this afternoon, took a nap and is feeling better. I informed patient that if she in not better by next week to call the office so we can get her in to be seen. Patient verbalized understanding.

## 2020-04-24 DIAGNOSIS — M1711 Unilateral primary osteoarthritis, right knee: Secondary | ICD-10-CM | POA: Diagnosis not present

## 2020-05-02 DIAGNOSIS — H938X9 Other specified disorders of ear, unspecified ear: Secondary | ICD-10-CM | POA: Diagnosis not present

## 2020-05-08 ENCOUNTER — Other Ambulatory Visit: Payer: Self-pay

## 2020-05-08 ENCOUNTER — Ambulatory Visit (INDEPENDENT_AMBULATORY_CARE_PROVIDER_SITE_OTHER): Payer: Medicare PPO

## 2020-05-08 DIAGNOSIS — J309 Allergic rhinitis, unspecified: Secondary | ICD-10-CM | POA: Diagnosis not present

## 2020-05-15 ENCOUNTER — Other Ambulatory Visit: Payer: Self-pay

## 2020-05-15 ENCOUNTER — Ambulatory Visit (INDEPENDENT_AMBULATORY_CARE_PROVIDER_SITE_OTHER): Payer: Medicare PPO

## 2020-05-15 DIAGNOSIS — J309 Allergic rhinitis, unspecified: Secondary | ICD-10-CM

## 2020-05-24 ENCOUNTER — Other Ambulatory Visit: Payer: Self-pay

## 2020-05-24 ENCOUNTER — Ambulatory Visit (INDEPENDENT_AMBULATORY_CARE_PROVIDER_SITE_OTHER): Payer: Medicare PPO

## 2020-05-24 ENCOUNTER — Other Ambulatory Visit: Payer: Self-pay | Admitting: Allergy

## 2020-05-24 DIAGNOSIS — J309 Allergic rhinitis, unspecified: Secondary | ICD-10-CM

## 2020-05-24 DIAGNOSIS — H9312 Tinnitus, left ear: Secondary | ICD-10-CM | POA: Diagnosis not present

## 2020-05-24 DIAGNOSIS — H6983 Other specified disorders of Eustachian tube, bilateral: Secondary | ICD-10-CM | POA: Diagnosis not present

## 2020-06-01 ENCOUNTER — Ambulatory Visit (INDEPENDENT_AMBULATORY_CARE_PROVIDER_SITE_OTHER): Payer: Medicare PPO | Admitting: Otolaryngology

## 2020-06-06 DIAGNOSIS — F419 Anxiety disorder, unspecified: Secondary | ICD-10-CM | POA: Diagnosis not present

## 2020-06-06 DIAGNOSIS — G479 Sleep disorder, unspecified: Secondary | ICD-10-CM | POA: Diagnosis not present

## 2020-06-06 DIAGNOSIS — F430 Acute stress reaction: Secondary | ICD-10-CM | POA: Diagnosis not present

## 2020-06-19 ENCOUNTER — Ambulatory Visit (INDEPENDENT_AMBULATORY_CARE_PROVIDER_SITE_OTHER): Payer: Medicare PPO

## 2020-06-19 ENCOUNTER — Other Ambulatory Visit: Payer: Self-pay

## 2020-06-19 DIAGNOSIS — J309 Allergic rhinitis, unspecified: Secondary | ICD-10-CM | POA: Diagnosis not present

## 2020-06-28 ENCOUNTER — Other Ambulatory Visit: Payer: Self-pay

## 2020-06-28 ENCOUNTER — Ambulatory Visit (INDEPENDENT_AMBULATORY_CARE_PROVIDER_SITE_OTHER): Payer: Medicare PPO

## 2020-06-28 DIAGNOSIS — J309 Allergic rhinitis, unspecified: Secondary | ICD-10-CM | POA: Diagnosis not present

## 2020-06-29 ENCOUNTER — Encounter: Payer: Self-pay | Admitting: Allergy

## 2020-06-29 ENCOUNTER — Ambulatory Visit: Payer: Medicare PPO | Admitting: Allergy

## 2020-06-29 VITALS — BP 124/88 | HR 84 | Temp 98.2°F | Ht 64.8 in | Wt 187.2 lb

## 2020-06-29 DIAGNOSIS — T7800XD Anaphylactic reaction due to unspecified food, subsequent encounter: Secondary | ICD-10-CM | POA: Diagnosis not present

## 2020-06-29 DIAGNOSIS — J4520 Mild intermittent asthma, uncomplicated: Secondary | ICD-10-CM

## 2020-06-29 DIAGNOSIS — H1013 Acute atopic conjunctivitis, bilateral: Secondary | ICD-10-CM | POA: Diagnosis not present

## 2020-06-29 DIAGNOSIS — J3089 Other allergic rhinitis: Secondary | ICD-10-CM | POA: Diagnosis not present

## 2020-06-29 DIAGNOSIS — J302 Other seasonal allergic rhinitis: Secondary | ICD-10-CM

## 2020-06-29 MED ORDER — QVAR REDIHALER 80 MCG/ACT IN AERB: 2.0000 | INHALATION_SPRAY | Freq: Two times a day (BID) | RESPIRATORY_TRACT | 1 refills | Status: DC

## 2020-06-29 NOTE — Patient Instructions (Addendum)
Allergic rhinitis with conjunctivitis    -continue avoidance measures to grass, weed, tree, molds, dust mites and cat    -continue allergen immunotherapy per schedule.      -for nasal drainage can use Astelin nasal spray 2 sprays each nostril 2 times a day as needed    -For nasal congestion use Flonase 2 sprays each nostril daily for 1-2 weeks at a time before stopping once nasal congestion improves for maximum benefit    -continue daily Allegra 180mg  as needed   Allergic asthma   -Allergen driven   -have access to albuterol inhaler 2 puffs every 4-6 hours as needed for cough/wheeze/shortness of breath/chest tightness.  May use 15-20 minutes prior to activity.   Monitor frequency of use.     - continue Qvar 48mcg  2 puff twice a day.      - Action Plan: During respiratory illnesses/flares use Symibcort 168mcg 2 puffs twice a day.  Once symptoms resolve can go back down to maintenance Qvar use    Control goals:   Full participation in all desired activities (may need albuterol before activity)  Albuterol use two time or less a week on average (not counting use with activity)  Cough interfering with sleep two time or less a month  Oral steroids no more than once a year  No hospitalizations  Shellfish allergy   -Continue avoidance of mollusks (clams, scallops, oysters)   -Have access to EpiPen as above in case of allergic reaction   -Follow emergency action plan in case of allergic reaction  - ok from my standpoint to use glucosamine not containing shellfish  Follow-up 6 months or sooner if needed

## 2020-06-29 NOTE — Progress Notes (Signed)
Follow-up Note  RE: Kathryn Davis MRN: [id number] DOB: September 22, 1950 Date of Office Visit: 06/29/2020   History of present illness: Kathryn Davis is a 70 y.o. female presenting today for follow-up of allergic rhinitis with conjunctivitis, allergic asthma and shellfish allergy.  She was last seen in the office on 1130 at 2021 for a sick visit.  At that time due to increase in her asthma symptoms it was recommended she stop Qvar and increase to Symbicort.  She states she has been babysitting her granddaughter who started daycare in September.  She attributes her illnesses over the winter do to likely a daycare exposure through her granddaughter. She states the day before thanksgiving she got sick with symptoms where she needed to use her albuterol including cough and wheeze.  She states the albuterol she had had expired as she had not needed to use it prior to that and did not know it had expired.   She states symptoms persisted and continued on thru Christmas.   She was treated with azithromycin course for 2 rounds.  She states it took about a month for the sinus pressure and congested to fully resolve.  She did see ENT due to the continued sinus pressure and congestion who states her exam was normal.  She also states she was having some ringing of the ears. She started taking flonase daily which has helped with congestion as well as the ringing in the ears.  She has also been performing nasal saline rinse. Since her symptoms have resolved she did go back to using Qvar 2 puffs twice a day which she continues on at this time. She states during the office she did have Covid testing and was negative She also has Astelin nasal spray for as needed use for nasal drainage.  There is also been advised that she use Allegra as needed but she states she has not been taking this medication as of late.  Review of systems the past 4 weeks: Review of Systems  Constitutional: Negative.   HENT:  Negative.   Eyes: Negative.   Respiratory: Negative.   Cardiovascular: Negative.   Gastrointestinal: Negative.   Musculoskeletal: Negative.   Skin: Negative.   Neurological: Negative.     All other systems negative unless noted above in HPI  Past medical/social/surgical/family history have been reviewed and are unchanged unless specifically indicated below.  No changes  Medication List: Current Outpatient Medications  Medication Sig Dispense Refill  . albuterol (VENTOLIN HFA) 108 (90 Base) MCG/ACT inhaler INHALE 2 PUFFS INTO THE LUNGS EVERY 4 HOURS AS NEEDED FOR WHEEZING OR SHORTNESS OF BREATH. 18 each 1  . atorvastatin (LIPITOR) 10 MG tablet Take 10 mg by mouth daily.  1  . azelastine (ASTELIN) 0.1 % nasal spray Place 2 sprays into both nostrils 2 (two) times daily. 30 mL 5  . budesonide-formoterol (SYMBICORT) 160-4.5 MCG/ACT inhaler 2 puffs twice daily with spacer to prevent coughing or wheezing. 1 each 5  . citalopram (CELEXA) 10 MG tablet Take 10 mg by mouth daily.    Marland Kitchen EPINEPHrine (EPIPEN 2-PAK) 0.3 mg/0.3 mL IJ SOAJ injection Use as directed for severe allergic reaction 2 Device 1  . Multiple Vitamin (MULTIVITAMIN PO) Take 1 tablet by mouth daily.    Marland Kitchen omeprazole (PRILOSEC) 40 MG capsule Take 1 capsule (40 mg total) by mouth daily. 30 capsule 5  . Spacer/Aero-Holding [facility] Use as directed. 1 each 1  . beclomethasone (QVAR REDIHALER) 80 MCG/ACT inhaler Inhale 2  puffs into the lungs 2 (two) times daily. 31.8 g 1   No current facility-administered medications for this visit.     Known medication allergies: Allergies  Allergen Reactions  . Shellfish Allergy Nausea And Vomiting     Physical examination: Blood pressure 124/88, pulse 84, temperature 98.2 F (36.8 C), height 5' 4.8" (1.646 m), weight 187 lb 3.2 oz (84.9 kg).  General: Alert, interactive, in no acute distress. HEENT: PERRLA, TMs pearly gray, turbinates non-edematous without discharge, post-pharynx  non erythematous. Neck: Supple without lymphadenopathy. Lungs: Clear to auscultation without wheezing, rhonchi or rales. {no increased work of breathing. CV: Normal S1, S2 without murmurs. Abdomen: Nondistended, nontender. Skin: Warm and dry, without lesions or rashes. Extremities:  No clubbing, cyanosis or edema. Neuro:   Grossly intact.  Diagnositics/Labs:  Spirometry: FEV1: 1.63L 73%, FVC: 2.23L 76% predicted.  This is an improvement over her previous study  Assessment and plan:   Allergic rhinitis with conjunctivitis    -continue avoidance measures to grass, weed, tree, molds, dust mites and cat    -continue allergen immunotherapy per schedule.      -for nasal drainage can use Astelin nasal spray 2 sprays each nostril 2 times a day as needed    -For nasal congestion use Flonase 2 sprays each nostril daily for 1-2 weeks at a time before stopping once nasal congestion improves for maximum benefit    -continue daily Allegra 180mg  as needed   Allergic asthma   -Allergen driven   -have access to albuterol inhaler 2 puffs every 4-6 hours as needed for cough/wheeze/shortness of breath/chest tightness.  May use 15-20 minutes prior to activity.   Monitor frequency of use.     - continue Qvar 25mcg  2 puff twice a day.      - Action Plan: During respiratory illnesses/flares use Symibcort 155mcg 2 puffs twice a day.  Once symptoms resolve can go back down to maintenance Qvar use    Control goals:   Full participation in all desired activities (may need albuterol before activity)  Albuterol use two time or less a week on average (not counting use with activity)  Cough interfering with sleep two time or less a month  Oral steroids no more than once a year  No hospitalizations  Shellfish allergy   -Continue avoidance of mollusks (clams, scallops, oysters)   -Have access to EpiPen as above in case of allergic reaction   -Follow emergency action plan in case of allergic reaction  - ok  from my standpoint to use glucosamine not containing shellfish  Follow-up 6 months or sooner if needed  I appreciate the opportunity to take part in [place] care. Please do not hesitate to contact me with questions.  Sincerely,   Prudy Feeler, MD Allergy/Immunology [facility] [place]

## 2020-07-17 ENCOUNTER — Ambulatory Visit (INDEPENDENT_AMBULATORY_CARE_PROVIDER_SITE_OTHER): Payer: Medicare PPO

## 2020-07-17 ENCOUNTER — Other Ambulatory Visit: Payer: Self-pay

## 2020-07-17 DIAGNOSIS — J309 Allergic rhinitis, unspecified: Secondary | ICD-10-CM | POA: Diagnosis not present

## 2020-07-18 ENCOUNTER — Ambulatory Visit: Payer: Medicare PPO | Admitting: Obstetrics & Gynecology

## 2020-07-18 ENCOUNTER — Other Ambulatory Visit: Payer: Self-pay | Admitting: Family Medicine

## 2020-07-18 DIAGNOSIS — Z1231 Encounter for screening mammogram for malignant neoplasm of breast: Secondary | ICD-10-CM

## 2020-07-26 ENCOUNTER — Encounter: Payer: Self-pay | Admitting: Obstetrics & Gynecology

## 2020-07-26 ENCOUNTER — Ambulatory Visit: Payer: Medicare PPO | Admitting: Obstetrics & Gynecology

## 2020-07-26 ENCOUNTER — Other Ambulatory Visit: Payer: Self-pay

## 2020-07-26 VITALS — BP 126/84

## 2020-07-26 DIAGNOSIS — Z4689 Encounter for fitting and adjustment of other specified devices: Secondary | ICD-10-CM

## 2020-07-26 NOTE — Progress Notes (Signed)
    [name] 11/16/1950 [id number]        70 y.o.  G2P2002   RP: Pessary maintenance  HPI: Well on pessary, with Milex ring #4 with support.  No vaginal bleeding.  No d/c.  No pain.  Urine/BMs normal.   OB History  Gravida Para Term Preterm AB Living  2 2 2     2   SAB IAB Ectopic Multiple Live Births               # Outcome Date GA Lbr Len/2nd Weight Sex Delivery Anes PTL Lv  2 Term           1 Term             Past medical history,surgical history, problem list, medications, allergies, family history and social history were all reviewed and documented in the EPIC chart.   Directed ROS with pertinent positives and negatives documented in the history of present illness/assessment and plan.  Exam:  Vitals:   07/26/20 1331  BP: 126/84   General appearance:  Normal  Gynecologic exam: Vulva normal.  Pessary removed and cleaned.  Vaginal mucosa intact.  Pessary easily put back in place.   Assessment/Plan:  70 y.o. G2P2002   1. Encounter for pessary maintenance Well with Milex ring #4 with support.  Vaginal mucosa intact.  Symptoms well controlled.  Patient will follow up in 4 months for maintenance and Annual gyn exam.   [name] MD, 2:08 PM 07/26/2020

## 2020-08-07 ENCOUNTER — Other Ambulatory Visit: Payer: Self-pay

## 2020-08-07 ENCOUNTER — Ambulatory Visit (INDEPENDENT_AMBULATORY_CARE_PROVIDER_SITE_OTHER): Payer: Medicare PPO

## 2020-08-07 DIAGNOSIS — J309 Allergic rhinitis, unspecified: Secondary | ICD-10-CM | POA: Diagnosis not present

## 2020-08-08 DIAGNOSIS — J3089 Other allergic rhinitis: Secondary | ICD-10-CM | POA: Diagnosis not present

## 2020-08-08 NOTE — Progress Notes (Signed)
VIALS EXP 08-08-21

## 2020-08-09 DIAGNOSIS — J301 Allergic rhinitis due to pollen: Secondary | ICD-10-CM | POA: Diagnosis not present

## 2020-08-17 ENCOUNTER — Ambulatory Visit: Payer: Medicare PPO | Admitting: Obstetrics & Gynecology

## 2020-08-28 ENCOUNTER — Other Ambulatory Visit: Payer: Self-pay

## 2020-08-28 ENCOUNTER — Ambulatory Visit (INDEPENDENT_AMBULATORY_CARE_PROVIDER_SITE_OTHER): Payer: Medicare PPO

## 2020-08-28 DIAGNOSIS — J309 Allergic rhinitis, unspecified: Secondary | ICD-10-CM | POA: Diagnosis not present

## 2020-09-11 ENCOUNTER — Ambulatory Visit
Admission: RE | Admit: 2020-09-11 | Discharge: 2020-09-11 | Disposition: A | Discharge status: Home / Self Care | Payer: Medicare PPO | Source: Ambulatory Visit | Attending: Family Medicine | Admitting: Family Medicine

## 2020-09-11 ENCOUNTER — Other Ambulatory Visit: Payer: Self-pay

## 2020-09-11 DIAGNOSIS — Z1231 Encounter for screening mammogram for malignant neoplasm of breast: Secondary | ICD-10-CM | POA: Diagnosis not present

## 2020-09-13 ENCOUNTER — Other Ambulatory Visit: Payer: Self-pay

## 2020-09-13 ENCOUNTER — Ambulatory Visit (INDEPENDENT_AMBULATORY_CARE_PROVIDER_SITE_OTHER): Payer: Medicare PPO

## 2020-09-13 DIAGNOSIS — J309 Allergic rhinitis, unspecified: Secondary | ICD-10-CM | POA: Diagnosis not present

## 2020-10-02 ENCOUNTER — Other Ambulatory Visit: Payer: Self-pay

## 2020-10-02 ENCOUNTER — Ambulatory Visit (INDEPENDENT_AMBULATORY_CARE_PROVIDER_SITE_OTHER): Payer: Medicare PPO

## 2020-10-02 DIAGNOSIS — J309 Allergic rhinitis, unspecified: Secondary | ICD-10-CM | POA: Diagnosis not present

## 2020-10-11 ENCOUNTER — Ambulatory Visit (INDEPENDENT_AMBULATORY_CARE_PROVIDER_SITE_OTHER): Payer: Medicare PPO

## 2020-10-11 ENCOUNTER — Other Ambulatory Visit: Payer: Self-pay

## 2020-10-11 DIAGNOSIS — J309 Allergic rhinitis, unspecified: Secondary | ICD-10-CM

## 2020-10-18 ENCOUNTER — Other Ambulatory Visit: Payer: Self-pay

## 2020-10-18 ENCOUNTER — Ambulatory Visit (INDEPENDENT_AMBULATORY_CARE_PROVIDER_SITE_OTHER): Payer: Medicare PPO

## 2020-10-18 DIAGNOSIS — J309 Allergic rhinitis, unspecified: Secondary | ICD-10-CM

## 2020-10-24 DIAGNOSIS — E782 Mixed hyperlipidemia: Secondary | ICD-10-CM | POA: Diagnosis not present

## 2020-10-24 DIAGNOSIS — E559 Vitamin D deficiency, unspecified: Secondary | ICD-10-CM | POA: Diagnosis not present

## 2020-10-24 DIAGNOSIS — F430 Acute stress reaction: Secondary | ICD-10-CM | POA: Diagnosis not present

## 2020-10-24 DIAGNOSIS — R7303 Prediabetes: Secondary | ICD-10-CM | POA: Diagnosis not present

## 2020-10-24 DIAGNOSIS — Z0000 Encounter for general adult medical examination without abnormal findings: Secondary | ICD-10-CM | POA: Diagnosis not present

## 2020-10-25 ENCOUNTER — Other Ambulatory Visit: Payer: Self-pay

## 2020-10-25 ENCOUNTER — Ambulatory Visit (INDEPENDENT_AMBULATORY_CARE_PROVIDER_SITE_OTHER): Payer: Medicare PPO | Admitting: *Deleted

## 2020-10-25 DIAGNOSIS — J309 Allergic rhinitis, unspecified: Secondary | ICD-10-CM | POA: Diagnosis not present

## 2020-10-25 DIAGNOSIS — R7303 Prediabetes: Secondary | ICD-10-CM | POA: Diagnosis not present

## 2020-10-25 DIAGNOSIS — E559 Vitamin D deficiency, unspecified: Secondary | ICD-10-CM | POA: Diagnosis not present

## 2020-10-25 DIAGNOSIS — E782 Mixed hyperlipidemia: Secondary | ICD-10-CM | POA: Diagnosis not present

## 2020-11-01 ENCOUNTER — Ambulatory Visit (INDEPENDENT_AMBULATORY_CARE_PROVIDER_SITE_OTHER): Payer: Medicare PPO

## 2020-11-01 ENCOUNTER — Other Ambulatory Visit: Payer: Self-pay

## 2020-11-01 DIAGNOSIS — J309 Allergic rhinitis, unspecified: Secondary | ICD-10-CM

## 2020-11-15 ENCOUNTER — Ambulatory Visit (INDEPENDENT_AMBULATORY_CARE_PROVIDER_SITE_OTHER): Payer: Medicare PPO

## 2020-11-15 ENCOUNTER — Other Ambulatory Visit: Payer: Self-pay

## 2020-11-15 DIAGNOSIS — J309 Allergic rhinitis, unspecified: Secondary | ICD-10-CM | POA: Diagnosis not present

## 2020-11-22 ENCOUNTER — Other Ambulatory Visit: Payer: Self-pay

## 2020-11-22 ENCOUNTER — Ambulatory Visit (INDEPENDENT_AMBULATORY_CARE_PROVIDER_SITE_OTHER): Payer: Medicare PPO | Admitting: Obstetrics & Gynecology

## 2020-11-22 ENCOUNTER — Encounter: Payer: Self-pay | Admitting: Obstetrics & Gynecology

## 2020-11-22 VITALS — BP 120/78 | HR 80 | Resp 16 | Ht 63.75 in | Wt 174.0 lb

## 2020-11-22 DIAGNOSIS — Z780 Asymptomatic menopausal state: Secondary | ICD-10-CM | POA: Diagnosis not present

## 2020-11-22 DIAGNOSIS — Z4689 Encounter for fitting and adjustment of other specified devices: Secondary | ICD-10-CM | POA: Diagnosis not present

## 2020-11-22 DIAGNOSIS — Z01419 Encounter for gynecological examination (general) (routine) without abnormal findings: Secondary | ICD-10-CM | POA: Diagnosis not present

## 2020-11-22 DIAGNOSIS — Z13820 Encounter for screening for osteoporosis: Secondary | ICD-10-CM | POA: Diagnosis not present

## 2020-11-22 DIAGNOSIS — R3915 Urgency of urination: Secondary | ICD-10-CM | POA: Diagnosis not present

## 2020-11-22 NOTE — Progress Notes (Signed)
Kathryn Davis 1950/10/02 [id number]   History:    70 y.o.  G2P2L2 Single   RP:  Established patient presenting for annual gyn exam    HPI: Postmenopause, well on no HRT.  Abstinent x many years.  Well on Milex ring #4 with support.  No vaginal d/c, no bleeding and no odor or itching.  No pelvic pain.  Occasional urgency of urine.  BMs normal. Breasts normal. Body mass index 30.1.  Working in the yard.  Health labs with family physician.     Past medical history,surgical history, family history and social history were all reviewed and documented in the EPIC chart.  Gynecologic History No LMP recorded. Patient is postmenopausal.  Obstetric History OB History  Gravida Para Term Preterm AB Living  '2 2 2     2  '$ SAB IAB Ectopic Multiple Live Births               # Outcome Date GA Lbr Len/2nd Weight Sex Delivery Anes PTL Lv  2 Term           1 Term              ROS: A ROS was performed and pertinent positives and negatives are included in the history.  GENERAL: No fevers or chills. HEENT: No change in vision, no earache, sore throat or sinus congestion. NECK: No pain or stiffness. CARDIOVASCULAR: No chest pain or pressure. No palpitations. PULMONARY: No shortness of breath, cough or wheeze. GASTROINTESTINAL: No abdominal pain, nausea, vomiting or diarrhea, melena or bright red blood per rectum. GENITOURINARY: No urinary frequency, urgency, hesitancy or dysuria. MUSCULOSKELETAL: No joint or muscle pain, no back pain, no recent trauma. DERMATOLOGIC: No rash, no itching, no lesions. ENDOCRINE: No polyuria, polydipsia, no heat or cold intolerance. No recent change in weight. HEMATOLOGICAL: No anemia or easy bruising or bleeding. NEUROLOGIC: No headache, seizures, numbness, tingling or weakness. PSYCHIATRIC: No depression, no loss of interest in normal activity or change in sleep pattern.     Exam:   BP 120/78   Pulse 80   Resp 16   Ht 5' 3.75" (1.619 m)   Wt 174 lb (78.9 kg)    BMI 30.10 kg/m   Body mass index is 30.1 kg/m.  General appearance : Well developed well nourished female. No acute distress HEENT: Eyes: no retinal hemorrhage or exudates,  Neck supple, trachea midline, no carotid bruits, no thyroidmegaly Lungs: Clear to auscultation, no rhonchi or wheezes, or rib retractions  Heart: Regular rate and rhythm, no murmurs or gallops Breast:Examined in sitting and supine position were symmetrical in appearance, no palpable masses or tenderness,  no skin retraction, no nipple inversion, no nipple discharge, no skin discoloration, no axillary or supraclavicular lymphadenopathy Abdomen: no palpable masses or tenderness, no rebound or guarding Extremities: no edema or skin discoloration or tenderness  Pelvic: Vulva: Normal             Vagina: No gross lesions or discharge.  Pessary removed and put back in place after cleaning it.  Vaginal mucosa intact.  Cervix: No gross lesions or discharge.    Uterus  AV, normal size, shape and consistency, non-tender and mobile  Adnexa  Without masses or tenderness  Anus: Normal   Assessment/Plan:  69 y.o. female for annual exam   1. Well female exam with routine gynecological exam Normal gynecologic exam in menopause with pelvic relaxation.  No indication for Pap test at this time.  Breast exam normal.  Screening mammogram May 2022 was negative.  Colonoscopy 2016.  2. Postmenopausal Well on no hormone replacement therapy.  No postmenopausal bleeding.  3. Encounter for pessary maintenance Well on Milex ring #4 with support.  Pessary removed and cleaned.  Vaginal mucosa intact.  4. Urinary urgency  Recommendation to decrease caffeine products discussed.  Increase water intake.  Empty bladder more frequently.  If not helped by those measures, will refer to urology.  5. Screening for osteoporosis Schedule bone density here now.  Vitamin D supplements, calcium intake of 1.5 g/day total and regular weightbearing  physical activities. - DG Bone Density; Future   [name] MD, 2:06 PM 11/22/2020

## 2020-11-25 ENCOUNTER — Encounter: Payer: Self-pay | Admitting: Obstetrics & Gynecology

## 2020-11-27 ENCOUNTER — Other Ambulatory Visit: Payer: Self-pay | Admitting: Family Medicine

## 2020-11-29 ENCOUNTER — Ambulatory Visit (INDEPENDENT_AMBULATORY_CARE_PROVIDER_SITE_OTHER): Payer: Medicare PPO

## 2020-11-29 ENCOUNTER — Other Ambulatory Visit: Payer: Self-pay

## 2020-11-29 DIAGNOSIS — J309 Allergic rhinitis, unspecified: Secondary | ICD-10-CM

## 2020-12-03 DIAGNOSIS — M1711 Unilateral primary osteoarthritis, right knee: Secondary | ICD-10-CM | POA: Diagnosis not present

## 2020-12-03 DIAGNOSIS — M1611 Unilateral primary osteoarthritis, right hip: Secondary | ICD-10-CM | POA: Diagnosis not present

## 2020-12-05 ENCOUNTER — Ambulatory Visit: Payer: Medicare PPO | Admitting: Obstetrics & Gynecology

## 2020-12-10 ENCOUNTER — Other Ambulatory Visit: Payer: Self-pay | Admitting: Allergy

## 2020-12-12 DIAGNOSIS — J3089 Other allergic rhinitis: Secondary | ICD-10-CM | POA: Diagnosis not present

## 2020-12-12 NOTE — Progress Notes (Signed)
VIALS MADE. EXP 12-12-21 

## 2020-12-13 DIAGNOSIS — J3081 Allergic rhinitis due to animal (cat) (dog) hair and dander: Secondary | ICD-10-CM | POA: Diagnosis not present

## 2020-12-20 ENCOUNTER — Other Ambulatory Visit: Payer: Self-pay

## 2020-12-20 ENCOUNTER — Ambulatory Visit (INDEPENDENT_AMBULATORY_CARE_PROVIDER_SITE_OTHER): Payer: Medicare PPO

## 2020-12-20 DIAGNOSIS — J309 Allergic rhinitis, unspecified: Secondary | ICD-10-CM

## 2021-01-02 ENCOUNTER — Other Ambulatory Visit: Payer: Self-pay

## 2021-01-02 ENCOUNTER — Ambulatory Visit: Payer: Medicare PPO | Admitting: Allergy

## 2021-01-02 ENCOUNTER — Encounter: Payer: Self-pay | Admitting: Allergy

## 2021-01-02 VITALS — BP 120/82 | HR 66 | Temp 98.7°F | Resp 18 | Ht 63.75 in | Wt 172.0 lb

## 2021-01-02 DIAGNOSIS — J3089 Other allergic rhinitis: Secondary | ICD-10-CM | POA: Diagnosis not present

## 2021-01-02 DIAGNOSIS — T7800XD Anaphylactic reaction due to unspecified food, subsequent encounter: Secondary | ICD-10-CM

## 2021-01-02 DIAGNOSIS — H1013 Acute atopic conjunctivitis, bilateral: Secondary | ICD-10-CM | POA: Diagnosis not present

## 2021-01-02 DIAGNOSIS — J4520 Mild intermittent asthma, uncomplicated: Secondary | ICD-10-CM

## 2021-01-02 DIAGNOSIS — J302 Other seasonal allergic rhinitis: Secondary | ICD-10-CM

## 2021-01-02 DIAGNOSIS — M25551 Pain in right hip: Secondary | ICD-10-CM | POA: Diagnosis not present

## 2021-01-02 NOTE — Patient Instructions (Addendum)
Allergic rhinitis with conjunctivitis    -continue avoidance measures to grass, weed, tree, molds, dust mites and cat    -continue allergen immunotherapy per schedule.      -for nasal drainage can use Astelin nasal spray 2 sprays each nostril 2 times a day as needed    -For nasal congestion use Flonase 2 sprays each nostril daily for 1-2 weeks at a time before stopping once nasal congestion improves for maximum benefit    -continue daily Allegra 180mg  as needed   Allergic asthma   -Allergen driven   -have access to albuterol inhaler 2 puffs every 4-6 hours as needed for cough/wheeze/shortness of breath/chest tightness.  May use 15-20 minutes prior to activity.   Monitor frequency of use.     - step-up to Symbicort 174mcg  2 puffs twice a day to improve current control  Qvar 29mcg  2 puff twice a day.      - Action Plan: During respiratory illnesses/flares if needed can add your Qvar 2 puffs twice a day in to Symbicort use.  Once illness is resolved and stop Qvar use    Control goals:  Full participation in all desired activities (may need albuterol before activity) Albuterol use two time or less a week on average (not counting use with activity) Cough interfering with sleep two time or less a month Oral steroids no more than once a year No hospitalizations  Shellfish allergy   -Continue avoidance of mollusks (clams, scallops, oysters)   -Have access to EpiPen as above in case of allergic reaction   -Follow emergency action plan in case of allergic reaction  Follow-up 4-6 months or sooner if needed

## 2021-01-02 NOTE — Progress Notes (Signed)
Follow-up Note  RE: [name] MRN: [id number] DOB: 1950-07-24 Date of Office Visit: 01/02/2021   History of present illness: Kathryn Davis is a 70 y.o. female presenting today for follow-up of allergic rhinitis with conjunctivitis and allergic asthma as well as food allergy.  She was last seen in the office on 06/29/20 by myself.  She has not had any health changes, surgeries or hospitalizations since last visit.  She does report having several viral URIs since last visit but has not had Covid fortunately.  She does report using symbicort once over the summer related to URI.  She states she does have some coughing spells when she is out and about.  She may need to use albuterol 1-2 times a week at this time.  She states she doesn't have symptoms in her home. She is using Qvar 2 puffs twice a day at this time.  She has not required UC/ED visits or any systemic steroid needs.  No nighttime awakenings.  She continues on allergen immunotherapy at maintenance dosing of every 3 weeks at this time.  Currently building back up from starting a new vial.  She has access to astelin for nasal drainage, flonase for nasal congestion and allegra for generalized allergy symptoms.  She continues to avoid mollusks and has not required epinephrine use.      Review of systems in the past 4 weeks: Review of Systems  Constitutional: Negative.   HENT: Negative.    Eyes: Negative.   Respiratory:  Positive for cough.   Cardiovascular: Negative.   Gastrointestinal: Negative.   Musculoskeletal: Negative.   Skin: Negative.   Neurological: Negative.    All other systems negative unless noted above in HPI  Past medical/social/surgical/family history have been reviewed and are unchanged unless specifically indicated below.  No changes  Medication List: Current Outpatient Medications  Medication Sig Dispense Refill   albuterol (VENTOLIN HFA) 108 (90 Base) MCG/ACT inhaler INHALE 2 PUFFS  INTO THE LUNGS EVERY 4 HOURS AS NEEDED FOR WHEEZING OR SHORTNESS OF BREATH. 18 each 1   atorvastatin (LIPITOR) 10 MG tablet Take 10 mg by mouth daily.  1   beclomethasone (QVAR REDIHALER) 80 MCG/ACT inhaler Inhale 2 puffs into the lungs 2 (two) times daily. 31.8 g 1   budesonide-formoterol (SYMBICORT) 160-4.5 MCG/ACT inhaler 2 PUFFS TWICE DAILY WITH SPACER TO PREVENT COUGHING OR WHEEZING. 10.2 each 0   citalopram (CELEXA) 10 MG tablet Take 10 mg by mouth daily.     EPINEPHrine (EPIPEN 2-PAK) 0.3 mg/0.3 mL IJ SOAJ injection Use as directed for severe allergic reaction 2 Device 1   Multiple Vitamin (MULTIVITAMIN PO) Take 1 tablet by mouth daily.     Spacer/Aero-Holding [facility] Use as directed. 1 each 1   No current facility-administered medications for this visit.     Known medication allergies: Allergies  Allergen Reactions   Shellfish Allergy Nausea And Vomiting     Physical examination: Blood pressure 120/82, pulse 66, temperature 98.7 F (37.1 C), temperature source Temporal, resp. rate 18, height 5' 3.75" (1.619 m), weight 172 lb (78 kg), SpO2 97 %.  General: Alert, interactive, in no acute distress. HEENT: PERRLA, TMs pearly gray, turbinates non-edematous without discharge, post-pharynx non erythematous. Neck: Supple without lymphadenopathy. Lungs: Clear to auscultation without wheezing, rhonchi or rales. {no increased work of breathing. CV: Normal S1, S2 without murmurs. Abdomen: Nondistended, nontender. Skin: Warm and dry, without lesions or rashes. Extremities:  No clubbing, cyanosis or edema. Neuro:  Grossly intact.  Diagnositics/Labs:  Spirometry: FEV1: 1.52 L 69%, FVC: 2.09 L 72% predicted.  This is likely lower than her previous study but is relatively stable   Assessment and plan:   Allergic rhinitis with conjunctivitis    -continue avoidance measures to grass, weed, tree, molds, dust mites and cat    -continue allergen immunotherapy per schedule.       -for nasal drainage can use Astelin nasal spray 2 sprays each nostril 2 times a day as needed    -For nasal congestion use Flonase 2 sprays each nostril daily for 1-2 weeks at a time before stopping once nasal congestion improves for maximum benefit    -continue daily Allegra 180mg  as needed   Allergic asthma   -Allergen driven   -have access to albuterol inhaler 2 puffs every 4-6 hours as needed for cough/wheeze/shortness of breath/chest tightness.  May use 15-20 minutes prior to activity.   Monitor frequency of use.     - step-up to Symbicort 111mcg  2 puffs twice a day to improve current control  Qvar 76mcg  2 puff twice a day.      - Action Plan: During respiratory illnesses/flares if needed can add your Qvar 2 puffs twice a day in to Symbicort use.  Once illness is resolved and stop Qvar use    Control goals:  Full participation in all desired activities (may need albuterol before activity) Albuterol use two time or less a week on average (not counting use with activity) Cough interfering with sleep two time or less a month Oral steroids no more than once a year No hospitalizations  Shellfish allergy   -Continue avoidance of mollusks (clams, scallops, oysters)   -Have access to EpiPen as above in case of allergic reaction   -Follow emergency action plan in case of allergic reaction  Follow-up 4-6 months or sooner if needed  Return in about 4 months (around 05/04/2021).  I appreciate the opportunity to take part in [place] care. Please do not hesitate to contact me with questions.  Sincerely,   Prudy Feeler, MD Allergy/Immunology [facility] [place]

## 2021-01-08 ENCOUNTER — Ambulatory Visit (INDEPENDENT_AMBULATORY_CARE_PROVIDER_SITE_OTHER): Payer: Medicare PPO

## 2021-01-08 ENCOUNTER — Other Ambulatory Visit: Payer: Self-pay

## 2021-01-08 DIAGNOSIS — J309 Allergic rhinitis, unspecified: Secondary | ICD-10-CM | POA: Diagnosis not present

## 2021-01-29 ENCOUNTER — Ambulatory Visit (INDEPENDENT_AMBULATORY_CARE_PROVIDER_SITE_OTHER): Payer: Medicare PPO

## 2021-01-29 ENCOUNTER — Other Ambulatory Visit: Payer: Self-pay

## 2021-01-29 DIAGNOSIS — J309 Allergic rhinitis, unspecified: Secondary | ICD-10-CM

## 2021-02-01 ENCOUNTER — Other Ambulatory Visit: Payer: Self-pay | Admitting: Allergy

## 2021-02-26 ENCOUNTER — Ambulatory Visit (INDEPENDENT_AMBULATORY_CARE_PROVIDER_SITE_OTHER): Payer: Medicare PPO

## 2021-02-26 ENCOUNTER — Other Ambulatory Visit: Payer: Self-pay

## 2021-02-26 DIAGNOSIS — J309 Allergic rhinitis, unspecified: Secondary | ICD-10-CM

## 2021-03-20 ENCOUNTER — Encounter: Payer: Self-pay | Admitting: Physician Assistant

## 2021-03-20 ENCOUNTER — Ambulatory Visit: Payer: Medicare PPO | Admitting: Physician Assistant

## 2021-03-20 ENCOUNTER — Other Ambulatory Visit: Payer: Self-pay

## 2021-03-20 DIAGNOSIS — B079 Viral wart, unspecified: Secondary | ICD-10-CM

## 2021-03-20 DIAGNOSIS — D485 Neoplasm of uncertain behavior of skin: Secondary | ICD-10-CM

## 2021-03-20 DIAGNOSIS — Z1283 Encounter for screening for malignant neoplasm of skin: Secondary | ICD-10-CM | POA: Diagnosis not present

## 2021-03-20 DIAGNOSIS — L820 Inflamed seborrheic keratosis: Secondary | ICD-10-CM | POA: Diagnosis not present

## 2021-03-20 DIAGNOSIS — L309 Dermatitis, unspecified: Secondary | ICD-10-CM | POA: Diagnosis not present

## 2021-03-20 MED ORDER — TRIAMCINOLONE ACETONIDE 0.1 % EX CREA: 1.0000 "application " | TOPICAL_CREAM | Freq: Every day | CUTANEOUS | 2 refills | Status: DC | PRN

## 2021-03-20 NOTE — Patient Instructions (Signed)

## 2021-03-20 NOTE — Progress Notes (Signed)
Follow-Up Visit   Subjective  Kathryn Davis is a 70 y.o. female who presents for the following: Annual Exam (New lesion under bra strap that get really irrigated. No personal history of melanoma or non mole skin cancers.  ).   The following portions of the chart were reviewed this encounter and updated as appropriate:  Tobacco  Allergies  Meds  Problems  Med Hx  Surg Hx  Fam Hx      Objective  Well appearing patient in no apparent distress; mood and affect are within normal limits.  A full examination was performed including scalp, head, eyes, ears, nose, lips, neck, chest, axillae, abdomen, back, buttocks, bilateral upper extremities, bilateral lower extremities, hands, feet, fingers, toes, fingernails, and toenails. All findings within normal limits unless otherwise noted below.  Right Buccal Cheek Brown crusted plaque.     Right Shoulder - Anterior Brown crusted plaque.     Right Thigh - Anterior Brown crusted plaque.     Left Thigh - Anterior Brown crusted plaque.     Left Hand - Posterior, Right Hand - Posterior Ill-defined pink papules/plaques with scale-crust.   Right Thigh - Anterior (6) Erythematous stuck-on plaque.   Neck - Anterior Verrucous papules -- Discussed viral etiology and contagion.    Assessment & Plan  Neoplasm of uncertain behavior of skin (4) Right Buccal Cheek  Skin / nail biopsy Type of biopsy: tangential   Informed consent: discussed and consent obtained   Timeout: patient name, date of birth, surgical site, and procedure verified   Anesthesia: the lesion was anesthetized in a standard fashion   Anesthetic:  1% lidocaine w/ epinephrine 1-100,000 local infiltration Instrument used: flexible razor blade   Hemostasis achieved with: ferric subsulfate   Outcome: patient tolerated procedure well   Post-procedure details: wound care instructions given    Specimen 1 - Surgical pathology Differential Diagnosis:  SK  Check Margins: No  Right Shoulder - Anterior  Skin / nail biopsy Type of biopsy: tangential   Informed consent: discussed and consent obtained   Timeout: patient name, date of birth, surgical site, and procedure verified   Anesthesia: the lesion was anesthetized in a standard fashion   Anesthetic:  1% lidocaine w/ epinephrine 1-100,000 local infiltration Instrument used: flexible razor blade   Hemostasis achieved with: ferric subsulfate   Outcome: patient tolerated procedure well   Post-procedure details: wound care instructions given    Specimen 2 - Surgical pathology Differential Diagnosis: SK  Check Margins: No  Right Thigh - Anterior  Skin / nail biopsy Type of biopsy: tangential   Informed consent: discussed and consent obtained   Timeout: patient name, date of birth, surgical site, and procedure verified   Anesthesia: the lesion was anesthetized in a standard fashion   Anesthetic:  1% lidocaine w/ epinephrine 1-100,000 local infiltration Instrument used: flexible razor blade   Hemostasis achieved with: ferric subsulfate   Outcome: patient tolerated procedure well   Post-procedure details: wound care instructions given    Specimen 3 - Surgical pathology Differential Diagnosis: SK  Check Margins: No  Left Thigh - Anterior  Skin / nail biopsy Type of biopsy: tangential   Informed consent: discussed and consent obtained   Timeout: patient name, date of birth, surgical site, and procedure verified   Anesthesia: the lesion was anesthetized in a standard fashion   Anesthetic:  1% lidocaine w/ epinephrine 1-100,000 local infiltration Instrument used: flexible razor blade   Hemostasis achieved with: ferric subsulfate   Outcome:  patient tolerated procedure well   Post-procedure details: wound care instructions given    Specimen 4 - Surgical pathology Differential Diagnosis: SK  Check Margins: No  Eczema, unspecified type Left Hand - Posterior; Right Hand -  Posterior  triamcinolone cream (KENALOG) 0.1 % - Left Hand - Posterior, Right Hand - Posterior Apply 1 application topically daily as needed.  Inflamed seborrheic keratosis Right Thigh - Anterior  Destruction of lesion - Right Thigh - Anterior Complexity: simple   Destruction method: cryotherapy   Informed consent: discussed and consent obtained   Timeout:  patient name, date of birth, surgical site, and procedure verified Lesion destroyed using liquid nitrogen: Yes   Cryotherapy cycles:  1 Outcome: patient tolerated procedure well with no complications   Post-procedure details: wound care instructions given    Viral warts, unspecified type Neck - Anterior  Destruction of lesion - Neck - Anterior Complexity: simple   Destruction method: cryotherapy   Informed consent: discussed and consent obtained   Timeout:  patient name, date of birth, surgical site, and procedure verified Lesion destroyed using liquid nitrogen: Yes   Cryotherapy cycles:  1 Outcome: patient tolerated procedure well with no complications   Post-procedure details: wound care instructions given      I, Jack Bolio, PA-C, have reviewed all documentation's for this visit.  The documentation on 03/20/21 for the exam, diagnosis, procedures and orders are all accurate and complete.

## 2021-03-21 ENCOUNTER — Ambulatory Visit (INDEPENDENT_AMBULATORY_CARE_PROVIDER_SITE_OTHER): Payer: Medicare PPO

## 2021-03-21 DIAGNOSIS — J309 Allergic rhinitis, unspecified: Secondary | ICD-10-CM | POA: Diagnosis not present

## 2021-03-26 ENCOUNTER — Ambulatory Visit (INDEPENDENT_AMBULATORY_CARE_PROVIDER_SITE_OTHER): Payer: Medicare PPO

## 2021-03-26 ENCOUNTER — Other Ambulatory Visit: Payer: Self-pay

## 2021-03-26 DIAGNOSIS — J309 Allergic rhinitis, unspecified: Secondary | ICD-10-CM | POA: Diagnosis not present

## 2021-04-02 ENCOUNTER — Ambulatory Visit (INDEPENDENT_AMBULATORY_CARE_PROVIDER_SITE_OTHER): Payer: Medicare PPO

## 2021-04-02 ENCOUNTER — Encounter: Payer: Self-pay | Admitting: Obstetrics & Gynecology

## 2021-04-02 ENCOUNTER — Other Ambulatory Visit: Payer: Self-pay | Admitting: Obstetrics & Gynecology

## 2021-04-02 ENCOUNTER — Other Ambulatory Visit: Payer: Self-pay

## 2021-04-02 ENCOUNTER — Ambulatory Visit (INDEPENDENT_AMBULATORY_CARE_PROVIDER_SITE_OTHER): Payer: Medicare PPO | Admitting: Obstetrics & Gynecology

## 2021-04-02 VITALS — BP 136/88 | HR 62

## 2021-04-02 DIAGNOSIS — M8588 Other specified disorders of bone density and structure, other site: Secondary | ICD-10-CM

## 2021-04-02 DIAGNOSIS — Z780 Asymptomatic menopausal state: Secondary | ICD-10-CM

## 2021-04-02 DIAGNOSIS — Z4689 Encounter for fitting and adjustment of other specified devices: Secondary | ICD-10-CM | POA: Diagnosis not present

## 2021-04-02 DIAGNOSIS — Z13820 Encounter for screening for osteoporosis: Secondary | ICD-10-CM

## 2021-04-02 NOTE — Progress Notes (Signed)
° ° °  Kathryn Davis November 23, 1950 [id number]        70 y.o.  G2P2002   RP: Pessary maintenance  HPI: Well on pessary, with Milex ring #4 with support.  No vaginal bleeding.  No d/c.  No pain.  Urine/BMs normal.   OB History  Gravida Para Term Preterm AB Living  2 2 2     2   SAB IAB Ectopic Multiple Live Births               # Outcome Date GA Lbr Len/2nd Weight Sex Delivery Anes PTL Lv  2 Term           1 Term             Past medical history,surgical history, problem list, medications, allergies, family history and social history were all reviewed and documented in the EPIC chart.   Directed ROS with pertinent positives and negatives documented in the history of present illness/assessment and plan.  Exam:  Vitals:   04/02/21 0938  BP: 136/88  Pulse: 62  SpO2: 98%    General appearance:  Normal   Gynecologic exam: Vulva normal.  Pessary removed and cleaned.  Vaginal mucosa intact.  Pessary easily put back in place.     Assessment/Plan:  70 y.o. G2P2002    1. Encounter for pessary maintenance Well with Milex ring #4 with support.  Vaginal mucosa intact.  Symptoms well controlled.  Patient will follow up in 4 months for maintenance of pessary.   [name] MD, 9:59 AM 04/02/2021

## 2021-04-09 ENCOUNTER — Other Ambulatory Visit: Payer: Self-pay

## 2021-04-09 ENCOUNTER — Ambulatory Visit (INDEPENDENT_AMBULATORY_CARE_PROVIDER_SITE_OTHER): Payer: Medicare PPO

## 2021-04-09 DIAGNOSIS — J309 Allergic rhinitis, unspecified: Secondary | ICD-10-CM

## 2021-04-16 ENCOUNTER — Other Ambulatory Visit: Payer: Self-pay

## 2021-04-16 ENCOUNTER — Ambulatory Visit (INDEPENDENT_AMBULATORY_CARE_PROVIDER_SITE_OTHER): Payer: Medicare PPO

## 2021-04-16 DIAGNOSIS — J309 Allergic rhinitis, unspecified: Secondary | ICD-10-CM | POA: Diagnosis not present

## 2021-04-25 ENCOUNTER — Ambulatory Visit (INDEPENDENT_AMBULATORY_CARE_PROVIDER_SITE_OTHER): Payer: Medicare PPO

## 2021-04-25 ENCOUNTER — Other Ambulatory Visit: Payer: Self-pay

## 2021-04-25 DIAGNOSIS — J309 Allergic rhinitis, unspecified: Secondary | ICD-10-CM

## 2021-04-26 ENCOUNTER — Ambulatory Visit: Payer: Medicare PPO | Admitting: Allergy

## 2021-04-26 ENCOUNTER — Encounter: Payer: Self-pay | Admitting: Allergy

## 2021-04-26 VITALS — BP 146/78 | HR 72 | Temp 98.2°F | Resp 16

## 2021-04-26 DIAGNOSIS — J3089 Other allergic rhinitis: Secondary | ICD-10-CM

## 2021-04-26 DIAGNOSIS — T7800XD Anaphylactic reaction due to unspecified food, subsequent encounter: Secondary | ICD-10-CM

## 2021-04-26 DIAGNOSIS — J4520 Mild intermittent asthma, uncomplicated: Secondary | ICD-10-CM

## 2021-04-26 DIAGNOSIS — J302 Other seasonal allergic rhinitis: Secondary | ICD-10-CM

## 2021-04-26 MED ORDER — ALBUTEROL SULFATE HFA 108 (90 BASE) MCG/ACT IN AERS: 2.0000 | INHALATION_SPRAY | RESPIRATORY_TRACT | 1 refills | Status: DC | PRN

## 2021-04-26 MED ORDER — BUDESONIDE-FORMOTEROL FUMARATE 160-4.5 MCG/ACT IN AERO: INHALATION_SPRAY | RESPIRATORY_TRACT | 5 refills | Status: DC

## 2021-04-26 MED ORDER — AZELASTINE HCL 0.1 % NA SOLN: 2.0000 | Freq: Two times a day (BID) | NASAL | 5 refills | Status: DC

## 2021-04-26 NOTE — Progress Notes (Signed)
Follow-up Note  RE: Kathryn Davis MRN: [id number] DOB: 1950-07-21 Date of Office Visit: 04/26/2021   History of present illness: Kathryn Davis is a 71 y.o. female presenting today for follow-up of allergic rhinitis with conjunctivitis, allergic asthma and shellfish allergy.  She was last seen in the office on 01/02/2021 by myself.  She has not had any health changes, surgeries or hospitalizations since this time  She states the symbicort is working well for her at this time.  She has not needed to use her rescue inhaler.  She is also not needed to add in the Qvar for flares.  She has not required any systemic steroids nor has she had any ED or urgent care visits.  She has not been sick at all since last visit.  She states however she does have a cough and chews gum to help with this.  She is not sure if it is from drainage.  She is throat clearing often.  She is taking flonase and states does seem to help but she still has the throat clearing and need to chew gum.  She does state the Flonase helps decrease popping in the ears.  She has used Astelin but states she has not been using this spray at this time.  She will need a refill.   She continues to avoid mollusks and has not had any to use her epinephrine device.  Review of systems: Review of Systems  Constitutional: Negative.   HENT:  Positive for postnasal drip.   Eyes: Negative.   Respiratory:  Positive for cough.   Cardiovascular: Negative.   Gastrointestinal: Negative.   Musculoskeletal: Negative.   Skin: Negative.   Allergic/Immunologic: Negative.   Neurological: Negative.     All other systems negative unless noted above in HPI  Past medical/social/surgical/family history have been reviewed and are unchanged unless specifically indicated below.  No changes  Medication List: Current Outpatient Medications  Medication Sig Dispense Refill   azelastine (ASTELIN) 0.1 % nasal spray Place 2 sprays into both  nostrils 2 (two) times daily. Use in each nostril as directed 30 mL 5   beclomethasone (QVAR REDIHALER) 80 MCG/ACT inhaler Inhale 2 puffs into the lungs 2 (two) times daily. 31.8 g 1   citalopram (CELEXA) 10 MG tablet Take 10 mg by mouth daily.     EPINEPHrine (EPIPEN 2-PAK) 0.3 mg/0.3 mL IJ SOAJ injection Use as directed for severe allergic reaction 2 Device 1   Multiple Vitamin (MULTIVITAMIN PO) Take 1 tablet by mouth daily.     Spacer/Aero-Holding [facility] Use as directed. 1 each 1   triamcinolone cream (KENALOG) 0.1 % Apply 1 application topically daily as needed. 453 g 2   albuterol (VENTOLIN HFA) 108 (90 Base) MCG/ACT inhaler Inhale 2 puffs into the lungs every 4 (four) hours as needed for wheezing or shortness of breath. 18 each 1   budesonide-formoterol (SYMBICORT) 160-4.5 MCG/ACT inhaler 2 PUFFS TWICE DAILY WITH SPACER TO PREVENT COUGHING OR WHEEZING. 10.2 each 5   No current facility-administered medications for this visit.     Known medication allergies: Allergies  Allergen Reactions   Molds & Smuts     Other reaction(s): Cough   Shellfish Allergy Nausea And Vomiting     Physical examination: Blood pressure (!) 146/78, pulse 72, temperature 98.2 F (36.8 C), temperature source Temporal, resp. rate 16, SpO2 95 %.  General: Alert, interactive, in no acute distress. HEENT: PERRLA, TMs pearly gray, turbinates mildly edematous with clear  discharge, post-pharynx non erythematous. Neck: Supple without lymphadenopathy. Lungs: Clear to auscultation without wheezing, rhonchi or rales. {no increased work of breathing. CV: Normal S1, S2 without murmurs. Abdomen: Nondistended, nontender. Skin: Warm and dry, without lesions or rashes. Extremities:  No clubbing, cyanosis or edema. Neuro:   Grossly intact.  Diagnositics/Labs:  Spirometry: FEV1: 1.55 L 72%, FVC: 2.12 L 77% predicted.  This is a slightly improved study from last visit  Assessment and plan:   Allergic rhinitis  with conjunctivitis    -continue avoidance measures to grass, weed, tree, molds, dust mites and cat    -continue allergen immunotherapy per schedule.      -for nasal drainage use Astelin nasal spray 2 sprays each nostril 2 times a day at this time.  Once drainage/throat clearing has improved then can use as needed    -For nasal congestion continue Flonase 2 sprays each nostril daily for 1-2 weeks at a time before stopping once nasal congestion improves for maximum benefit    -continue daily Allegra 180mg  as needed   Allergic asthma   -Allergen driven   -have access to albuterol inhaler 2 puffs every 4-6 hours as needed for cough/wheeze/shortness of breath/chest tightness.  May use 15-20 minutes prior to activity.   Monitor frequency of use.     - Symbicort 149mcg 2 puffs twice a day to improve current control.  Will hope to be able to decrease back to Qvar during spring/warmer months    - Action Plan: During respiratory illnesses/flares if needed can add your Qvar 2 puffs twice a day in to Symbicort use.  Once illness is resolved and stop Qvar use    Control goals:  Full participation in all desired activities (may need albuterol before activity) Albuterol use two time or less a week on average (not counting use with activity) Cough interfering with sleep two time or less a month Oral steroids no more than once a year No hospitalizations  Shellfish allergy   -Continue avoidance of mollusks (clams, scallops, oysters)   -Have access to EpiPen as above in case of allergic reaction   -Follow emergency action plan in case of allergic reaction  Follow-up 4-6 months or sooner if needed  I appreciate the opportunity to take part in [place] care. Please do not hesitate to contact me with questions.  Sincerely,   Prudy Feeler, MD Allergy/Immunology [facility]

## 2021-04-26 NOTE — Patient Instructions (Addendum)
Allergic rhinitis with conjunctivitis    -continue avoidance measures to grass, weed, tree, molds, dust mites and cat    -continue allergen immunotherapy per schedule.      -for nasal drainage use Astelin nasal spray 2 sprays each nostril 2 times a day at this time.  Once drainage/throat clearing has improved then can use as needed    -For nasal congestion continue Flonase 2 sprays each nostril daily for 1-2 weeks at a time before stopping once nasal congestion improves for maximum benefit    -continue daily Allegra 180mg  as needed   Allergic asthma   -Allergen driven   -have access to albuterol inhaler 2 puffs every 4-6 hours as needed for cough/wheeze/shortness of breath/chest tightness.  May use 15-20 minutes prior to activity.   Monitor frequency of use.     - Symbicort 158mcg 2 puffs twice a day to improve current control.  Will hope to be able to decrease back to Qvar during spring/warmer months    - Action Plan: During respiratory illnesses/flares if needed can add your Qvar 2 puffs twice a day in to Symbicort use.  Once illness is resolved and stop Qvar use    Control goals:  Full participation in all desired activities (may need albuterol before activity) Albuterol use two time or less a week on average (not counting use with activity) Cough interfering with sleep two time or less a month Oral steroids no more than once a year No hospitalizations  Shellfish allergy   -Continue avoidance of mollusks (clams, scallops, oysters)   -Have access to EpiPen as above in case of allergic reaction   -Follow emergency action plan in case of allergic reaction  Follow-up 4-6 months or sooner if needed

## 2021-05-14 ENCOUNTER — Ambulatory Visit (INDEPENDENT_AMBULATORY_CARE_PROVIDER_SITE_OTHER): Payer: Medicare PPO

## 2021-05-14 ENCOUNTER — Other Ambulatory Visit: Payer: Self-pay

## 2021-05-14 DIAGNOSIS — J309 Allergic rhinitis, unspecified: Secondary | ICD-10-CM | POA: Diagnosis not present

## 2021-05-30 ENCOUNTER — Ambulatory Visit (INDEPENDENT_AMBULATORY_CARE_PROVIDER_SITE_OTHER): Payer: Medicare PPO

## 2021-05-30 ENCOUNTER — Other Ambulatory Visit: Payer: Self-pay

## 2021-05-30 DIAGNOSIS — J309 Allergic rhinitis, unspecified: Secondary | ICD-10-CM

## 2021-06-20 ENCOUNTER — Other Ambulatory Visit: Payer: Self-pay

## 2021-06-20 ENCOUNTER — Ambulatory Visit (INDEPENDENT_AMBULATORY_CARE_PROVIDER_SITE_OTHER): Payer: Medicare PPO

## 2021-06-20 DIAGNOSIS — J309 Allergic rhinitis, unspecified: Secondary | ICD-10-CM | POA: Diagnosis not present

## 2021-07-08 DIAGNOSIS — J3089 Other allergic rhinitis: Secondary | ICD-10-CM | POA: Diagnosis not present

## 2021-07-08 NOTE — Progress Notes (Signed)
VIALS EXP 07-09-22 ?

## 2021-07-09 DIAGNOSIS — J3081 Allergic rhinitis due to animal (cat) (dog) hair and dander: Secondary | ICD-10-CM | POA: Diagnosis not present

## 2021-07-11 ENCOUNTER — Ambulatory Visit (INDEPENDENT_AMBULATORY_CARE_PROVIDER_SITE_OTHER): Payer: Medicare PPO

## 2021-07-11 DIAGNOSIS — J309 Allergic rhinitis, unspecified: Secondary | ICD-10-CM | POA: Diagnosis not present

## 2021-08-01 ENCOUNTER — Ambulatory Visit (INDEPENDENT_AMBULATORY_CARE_PROVIDER_SITE_OTHER): Payer: Medicare PPO

## 2021-08-01 DIAGNOSIS — J309 Allergic rhinitis, unspecified: Secondary | ICD-10-CM | POA: Diagnosis not present

## 2021-08-05 ENCOUNTER — Ambulatory Visit: Payer: Medicare PPO | Admitting: Obstetrics & Gynecology

## 2021-08-05 ENCOUNTER — Encounter: Payer: Self-pay | Admitting: Obstetrics & Gynecology

## 2021-08-05 VITALS — BP 140/80 | Ht 63.0 in | Wt 174.0 lb

## 2021-08-05 DIAGNOSIS — Z4689 Encounter for fitting and adjustment of other specified devices: Secondary | ICD-10-CM | POA: Diagnosis not present

## 2021-08-05 NOTE — Progress Notes (Signed)
? ? ?  Kathryn Davis [birth year] [id number] ? ? ?     71 y.o.   [id number]  ?  ?RP: Pessary maintenance ?  ?HPI: Well on pessary, with Milex ring #4 with support.  No vaginal bleeding.  No d/c.  No pain.  Urine/BMs normal. ? ? ?OB History  ?Gravida Para Term Preterm AB Living  ?'2 2 2     2  '$ ?SAB IAB Ectopic Multiple Live Births  ?           ?  ?# Outcome Date GA Lbr Len/2nd Weight Sex Delivery Anes PTL Lv  ?2 Term           ?1 Term           ? ? ?Past medical history,surgical history, problem list, medications, allergies, family history and social history were all reviewed and documented in the EPIC chart. ? ? ?Directed ROS with pertinent positives and negatives documented in the history of present illness/assessment and plan. ? ?Exam: ? ?Vitals:  ? 08/05/21 0857  ?BP: 140/80  ?Weight: 174 lb (78.9 kg)  ?Height: '5\' 3"'$  (1.6 m)  ? ?General appearance:  Normal ? ?Gynecologic exam: Vulva normal.  Pessary removed and cleaned.  Vaginal mucosa intact.  Pessary easily put back in place. ?  ?  ?Assessment/Plan:  71 y.o. [id number]  ?  ?1. Encounter for pessary maintenance ?Well with Milex ring #4 with support.  Vaginal mucosa intact.  Symptoms well controlled.  Patient will follow up in 6 months for maintenance of pessary. ?  ?[name] MD, 9:05 AM 08/05/2021 ? ? ? ?  ?

## 2021-08-22 ENCOUNTER — Ambulatory Visit (INDEPENDENT_AMBULATORY_CARE_PROVIDER_SITE_OTHER): Payer: Medicare PPO

## 2021-08-22 DIAGNOSIS — J309 Allergic rhinitis, unspecified: Secondary | ICD-10-CM | POA: Diagnosis not present

## 2021-08-29 ENCOUNTER — Ambulatory Visit (INDEPENDENT_AMBULATORY_CARE_PROVIDER_SITE_OTHER): Payer: Medicare PPO

## 2021-08-29 DIAGNOSIS — J309 Allergic rhinitis, unspecified: Secondary | ICD-10-CM | POA: Diagnosis not present

## 2021-09-05 ENCOUNTER — Ambulatory Visit (INDEPENDENT_AMBULATORY_CARE_PROVIDER_SITE_OTHER): Payer: Medicare PPO

## 2021-09-05 DIAGNOSIS — J309 Allergic rhinitis, unspecified: Secondary | ICD-10-CM

## 2021-09-12 ENCOUNTER — Ambulatory Visit (INDEPENDENT_AMBULATORY_CARE_PROVIDER_SITE_OTHER): Payer: Medicare PPO

## 2021-09-12 DIAGNOSIS — J309 Allergic rhinitis, unspecified: Secondary | ICD-10-CM

## 2021-09-18 ENCOUNTER — Ambulatory Visit (INDEPENDENT_AMBULATORY_CARE_PROVIDER_SITE_OTHER): Payer: Medicare PPO | Admitting: Physician Assistant

## 2021-09-18 ENCOUNTER — Encounter: Payer: Self-pay | Admitting: Physician Assistant

## 2021-09-18 DIAGNOSIS — L820 Inflamed seborrheic keratosis: Secondary | ICD-10-CM | POA: Diagnosis not present

## 2021-09-18 DIAGNOSIS — D485 Neoplasm of uncertain behavior of skin: Secondary | ICD-10-CM

## 2021-09-18 DIAGNOSIS — B078 Other viral warts: Secondary | ICD-10-CM

## 2021-09-18 DIAGNOSIS — B079 Viral wart, unspecified: Secondary | ICD-10-CM | POA: Diagnosis not present

## 2021-09-18 NOTE — Progress Notes (Signed)
Follow-Up Visit   Subjective  Kathryn Davis is a 71 y.o. female who presents for the following: Follow-up (6 months for more removals ran out of time at the last visit per patient all ISK's).   The following portions of the chart were reviewed this encounter and updated as appropriate:  Tobacco  Allergies  Meds  Problems  Med Hx  Surg Hx  Fam Hx      Objective  Well appearing patient in no apparent distress; mood and affect are within normal limits.  A full examination was performed including scalp, head, eyes, ears, nose, lips, neck, chest, axillae, abdomen, back, buttocks, bilateral upper extremities, bilateral lower extremities, hands, feet, fingers, toes, fingernails, and toenails. All findings within normal limits unless otherwise noted below.  Left Abdomen (side) - Lower Thick black crusted plaque     Left Forearm - Posterior Hyperkeratotic horn     Left Upper Arm - Posterior Hyperkeratotic horn  Left Popliteal Fossa Hyperkeratotic horn   Assessment & Plan  Neoplasm of uncertain behavior of skin (4) Left Abdomen (side) - Lower  Skin / nail biopsy Type of biopsy: tangential   Informed consent: discussed and consent obtained   Timeout: patient name, date of birth, surgical site, and procedure verified   Procedure prep:  Patient was prepped and draped in usual sterile fashion (Non sterile) Prep type:  Chlorhexidine Anesthesia: the lesion was anesthetized in a standard fashion   Anesthetic:  1% lidocaine w/ epinephrine 1-100,000 local infiltration Hemostasis achieved with: pressure and aluminum chloride   Outcome: patient tolerated procedure well   Post-procedure details: wound care instructions given    Specimen 1 - Surgical pathology Differential Diagnosis: isk  Check Margins: No  Left Forearm - Posterior  Skin / nail biopsy Type of biopsy: tangential   Informed consent: discussed and consent obtained   Timeout: patient name, date of  birth, surgical site, and procedure verified   Procedure prep:  Patient was prepped and draped in usual sterile fashion (Non sterile) Prep type:  Chlorhexidine Anesthesia: the lesion was anesthetized in a standard fashion   Anesthetic:  1% lidocaine w/ epinephrine 1-100,000 local infiltration Instrument used: flexible razor blade   Outcome: patient tolerated procedure well   Post-procedure details: wound care instructions given    Specimen 2 - Surgical pathology Differential Diagnosis: isk  Check Margins: No  Left Upper Arm - Posterior  Skin / nail biopsy Type of biopsy: tangential   Informed consent: discussed and consent obtained   Timeout: patient name, date of birth, surgical site, and procedure verified   Procedure prep:  Patient was prepped and draped in usual sterile fashion (Non sterile) Prep type:  Chlorhexidine Anesthesia: the lesion was anesthetized in a standard fashion   Anesthetic:  1% lidocaine w/ epinephrine 1-100,000 local infiltration Instrument used: flexible razor blade   Hemostasis achieved with: ferric subsulfate   Outcome: patient tolerated procedure well   Post-procedure details: wound care instructions given    Specimen 3 - Surgical pathology Differential Diagnosis: isk  Check Margins: No  Left Popliteal Fossa  Skin / nail biopsy Type of biopsy: tangential   Timeout: patient name, date of birth, surgical site, and procedure verified   Instrument used: flexible razor blade   Hemostasis achieved with: ferric subsulfate   Outcome: patient tolerated procedure well   Post-procedure details: wound care instructions given    Specimen 4 - Surgical pathology Differential Diagnosis: isk  Check Margins: No    I, Sintia Mckissic, PA-C,  have reviewed all documentation's for this visit.  The documentation on 09/18/21 for the exam, diagnosis, procedures and orders are all accurate and complete.

## 2021-09-18 NOTE — Patient Instructions (Signed)

## 2021-09-19 ENCOUNTER — Ambulatory Visit (INDEPENDENT_AMBULATORY_CARE_PROVIDER_SITE_OTHER): Payer: Medicare PPO

## 2021-09-19 DIAGNOSIS — J309 Allergic rhinitis, unspecified: Secondary | ICD-10-CM | POA: Diagnosis not present

## 2021-10-07 DIAGNOSIS — M1711 Unilateral primary osteoarthritis, right knee: Secondary | ICD-10-CM | POA: Diagnosis not present

## 2021-10-07 DIAGNOSIS — M1611 Unilateral primary osteoarthritis, right hip: Secondary | ICD-10-CM | POA: Diagnosis not present

## 2021-10-10 ENCOUNTER — Ambulatory Visit (INDEPENDENT_AMBULATORY_CARE_PROVIDER_SITE_OTHER): Payer: Medicare PPO

## 2021-10-10 DIAGNOSIS — J309 Allergic rhinitis, unspecified: Secondary | ICD-10-CM

## 2021-10-17 ENCOUNTER — Other Ambulatory Visit: Payer: Self-pay | Admitting: Family Medicine

## 2021-10-17 DIAGNOSIS — Z1231 Encounter for screening mammogram for malignant neoplasm of breast: Secondary | ICD-10-CM

## 2021-10-18 ENCOUNTER — Ambulatory Visit
Admission: RE | Admit: 2021-10-18 | Discharge: 2021-10-18 | Disposition: A | Discharge status: Home / Self Care | Payer: Medicare PPO | Source: Ambulatory Visit | Attending: Family Medicine | Admitting: Family Medicine

## 2021-10-18 DIAGNOSIS — Z1231 Encounter for screening mammogram for malignant neoplasm of breast: Secondary | ICD-10-CM | POA: Diagnosis not present

## 2021-10-22 ENCOUNTER — Other Ambulatory Visit: Payer: Self-pay | Admitting: Family Medicine

## 2021-10-22 DIAGNOSIS — R928 Other abnormal and inconclusive findings on diagnostic imaging of breast: Secondary | ICD-10-CM

## 2021-10-25 DIAGNOSIS — R7303 Prediabetes: Secondary | ICD-10-CM | POA: Diagnosis not present

## 2021-10-25 DIAGNOSIS — E559 Vitamin D deficiency, unspecified: Secondary | ICD-10-CM | POA: Diagnosis not present

## 2021-10-25 DIAGNOSIS — Z1389 Encounter for screening for other disorder: Secondary | ICD-10-CM | POA: Diagnosis not present

## 2021-10-25 DIAGNOSIS — Z0000 Encounter for general adult medical examination without abnormal findings: Secondary | ICD-10-CM | POA: Diagnosis not present

## 2021-10-25 DIAGNOSIS — E782 Mixed hyperlipidemia: Secondary | ICD-10-CM | POA: Diagnosis not present

## 2021-10-28 ENCOUNTER — Ambulatory Visit
Admission: RE | Admit: 2021-10-28 | Discharge: 2021-10-28 | Disposition: A | Discharge status: Home / Self Care | Payer: Medicare PPO | Source: Ambulatory Visit | Attending: Family Medicine | Admitting: Family Medicine

## 2021-10-28 ENCOUNTER — Other Ambulatory Visit: Payer: Self-pay | Admitting: Allergy

## 2021-10-28 ENCOUNTER — Other Ambulatory Visit: Payer: Self-pay | Admitting: Family Medicine

## 2021-10-28 DIAGNOSIS — N6320 Unspecified lump in the left breast, unspecified quadrant: Secondary | ICD-10-CM

## 2021-10-28 DIAGNOSIS — N6321 Unspecified lump in the left breast, upper outer quadrant: Secondary | ICD-10-CM | POA: Diagnosis not present

## 2021-10-28 DIAGNOSIS — R928 Other abnormal and inconclusive findings on diagnostic imaging of breast: Secondary | ICD-10-CM

## 2021-10-31 ENCOUNTER — Ambulatory Visit (INDEPENDENT_AMBULATORY_CARE_PROVIDER_SITE_OTHER): Payer: Medicare PPO

## 2021-10-31 DIAGNOSIS — J309 Allergic rhinitis, unspecified: Secondary | ICD-10-CM

## 2021-11-01 ENCOUNTER — Encounter: Payer: Self-pay | Admitting: Allergy

## 2021-11-01 ENCOUNTER — Ambulatory Visit: Payer: Medicare PPO | Admitting: Allergy

## 2021-11-01 VITALS — BP 122/74 | HR 66 | Temp 97.8°F | Resp 18 | Ht 63.0 in | Wt 173.0 lb

## 2021-11-01 DIAGNOSIS — E782 Mixed hyperlipidemia: Secondary | ICD-10-CM | POA: Insufficient documentation

## 2021-11-01 DIAGNOSIS — F430 Acute stress reaction: Secondary | ICD-10-CM | POA: Insufficient documentation

## 2021-11-01 DIAGNOSIS — Z01818 Encounter for other preprocedural examination: Secondary | ICD-10-CM | POA: Diagnosis not present

## 2021-11-01 DIAGNOSIS — J302 Other seasonal allergic rhinitis: Secondary | ICD-10-CM

## 2021-11-01 DIAGNOSIS — Z6830 Body mass index (BMI) 30.0-30.9, adult: Secondary | ICD-10-CM | POA: Diagnosis not present

## 2021-11-01 DIAGNOSIS — Z91013 Allergy to seafood: Secondary | ICD-10-CM | POA: Insufficient documentation

## 2021-11-01 DIAGNOSIS — M1611 Unilateral primary osteoarthritis, right hip: Secondary | ICD-10-CM | POA: Diagnosis not present

## 2021-11-01 DIAGNOSIS — J3089 Other allergic rhinitis: Secondary | ICD-10-CM

## 2021-11-01 DIAGNOSIS — J4520 Mild intermittent asthma, uncomplicated: Secondary | ICD-10-CM

## 2021-11-01 DIAGNOSIS — R7303 Prediabetes: Secondary | ICD-10-CM | POA: Insufficient documentation

## 2021-11-01 DIAGNOSIS — G479 Sleep disorder, unspecified: Secondary | ICD-10-CM | POA: Insufficient documentation

## 2021-11-01 DIAGNOSIS — F419 Anxiety disorder, unspecified: Secondary | ICD-10-CM | POA: Diagnosis not present

## 2021-11-01 DIAGNOSIS — E559 Vitamin D deficiency, unspecified: Secondary | ICD-10-CM | POA: Diagnosis not present

## 2021-11-01 DIAGNOSIS — J45909 Unspecified asthma, uncomplicated: Secondary | ICD-10-CM | POA: Insufficient documentation

## 2021-11-01 DIAGNOSIS — T7800XD Anaphylactic reaction due to unspecified food, subsequent encounter: Secondary | ICD-10-CM

## 2021-11-01 MED ORDER — IPRATROPIUM BROMIDE 0.06 % NA SOLN: 2.0000 | Freq: Two times a day (BID) | NASAL | 2 refills | Status: DC

## 2021-11-01 MED ORDER — BUDESONIDE-FORMOTEROL FUMARATE 160-4.5 MCG/ACT IN AERO: INHALATION_SPRAY | RESPIRATORY_TRACT | 1 refills | Status: DC

## 2021-11-01 NOTE — Progress Notes (Signed)
Follow-up Note  RE: Kathryn Davis MRN: [id number] DOB: 07-Sep-1950 Date of Office Visit: 11/01/2021   History of present illness: Kathryn Davis is a 71 y.o. female presenting today for follow-up of allergic rhinitis with conjunctivitis, allergic asthma and shellfish allergy.  She was last seen in the office on 04/26/2021 by myself.  She still has the same cough and she does take her flonase and Astelin daily.  She also takes Human resources officer. The cough seems to be in different environments.  She has throat clearing all throughout the day.  It is not as bad in the mornings.  She states the cough does seem to be better when she chews gum but she doesn't want to chew gum often. She did get rid of her mattress topper.  She she is making sure that her house is clean.  With this cough and throat clearing she does not have any wheezing or shortness of breath or chest tightness symptoms.   She has not required albuterol use.  She does continue to use Symbicort 2 puffs twice a day.  She has not had any ED or urgent care visits or any systemic steroid needs. She does continue to avoid shellfish.  She has never required epinephrine use.  Review of systems: Review of Systems  Constitutional: Negative.   HENT:         See HPI  Eyes: Negative.   Respiratory:         See HPI  Cardiovascular: Negative.   Gastrointestinal: Negative.   Musculoskeletal: Negative.   Skin: Negative.   Allergic/Immunologic: Negative.   Neurological: Negative.      All other systems negative unless noted above in HPI  Past medical/social/surgical/family history have been reviewed and are unchanged unless specifically indicated below.  No changes  Medication List: Current Outpatient Medications  Medication Sig Dispense Refill   albuterol (VENTOLIN HFA) 108 (90 Base) MCG/ACT inhaler Inhale 2 puffs into the lungs every 4 (four) hours as needed for wheezing or shortness of breath. 18 each 1    budesonide-formoterol (SYMBICORT) 160-4.5 MCG/ACT inhaler 2 PUFFS TWICE DAILY WITH SPACER TO PREVENT COUGHING OR WHEEZING. 10.2 each 5   citalopram (CELEXA) 10 MG tablet Take 10 mg by mouth daily.     EPINEPHrine (EPIPEN 2-PAK) 0.3 mg/0.3 mL IJ SOAJ injection Use as directed for severe allergic reaction 2 Device 1   Multiple Vitamin (MULTIVITAMIN PO) Take 1 tablet by mouth daily.     Spacer/Aero-Holding [facility] Use as directed. 1 each 1   triamcinolone cream (KENALOG) 0.1 % Apply 1 application topically daily as needed. 453 g 2   No current facility-administered medications for this visit.     Known medication allergies: Allergies  Allergen Reactions   Molds & Smuts     Other reaction(s): Cough   Shellfish Allergy Nausea And Vomiting     Physical examination: Blood pressure 122/74, pulse 66, temperature 97.8 F (36.6 C), resp. rate 18, height '5\' 3"'$  (1.6 m), weight 173 lb (78.5 kg), SpO2 97 %.  General: Alert, interactive, in no acute distress. HEENT: PERRLA, TMs pearly gray, turbinates minimally edematous with clear discharge, post-pharynx non erythematous. Neck: Supple without lymphadenopathy. Lungs: Clear to auscultation without wheezing, rhonchi or rales. {no increased work of breathing. CV: Normal S1, S2 without murmurs. Abdomen: Nondistended, nontender. Skin: Warm and dry, without lesions or rashes. Extremities:  No clubbing, cyanosis or edema. Neuro:   Grossly intact.  Diagnositics/Labs:  Spirometry: FEV1: 1.55L 73%, FVC: 2.23L  81%, ratio consistent with essentially nonobstructive pattern for age and demographic  Assessment and plan:   Allergic rhinitis with conjunctivitis    -continue avoidance measures to grass, weed, tree, molds, dust mites and cat    -continue allergen immunotherapy per schedule.      -Believe her current symptoms of throat clearing and cough are due to continued postnasal drainage since not being controlled with Astelin.    -stop Astelin as  not effective enough    -start Atrovent nasal spray 2 sprays each nostril twice a day.   Can be used up to 4 times a day if needed for nasal drainage control.   I'm hopefully this will decrease nasal drainage, post nasal drip and throat clearing which would then lead to reduced cough    -For nasal congestion continue Flonase 2 sprays each nostril daily for 1-2 weeks at a time before stopping once nasal congestion improves for maximum benefit.   May find that you don't need this spray with use of Atrovent but you can use both if needed    -continue daily Allegra '180mg'$   Allergic asthma   -Allergen driven   -have access to albuterol inhaler 2 puffs every 4-6 hours as needed for cough/wheeze/shortness of breath/chest tightness.  May use 15-20 minutes prior to activity.   Monitor frequency of use.     - Symbicort 172mg 2 puffs twice a day to improve current control.     - Action Plan: During respiratory illnesses/flares if needed can add your Qvar 2 puffs twice a day in to Symbicort use.  Once illness is resolved and stop Qvar use    Control goals:  Full participation in all desired activities (may need albuterol before activity) Albuterol use two time or less a week on average (not counting use with activity) Cough interfering with sleep two time or less a month Oral steroids no more than once a year No hospitalizations  Shellfish allergy   -Continue avoidance of mollusks (clams, scallops, oysters)   -Have access to EpiPen as above in case of allergic reaction   -Follow emergency action plan in case of allergic reaction  Follow-up 4-6 months or sooner if needed  I appreciate the opportunity to take part in [facility]. Please do not hesitate to contact me with questions.  Sincerely,   SPrudy Feeler MD Allergy/Immunology [facility]

## 2021-11-01 NOTE — Patient Instructions (Addendum)
Allergic rhinitis with conjunctivitis    -continue avoidance measures to grass, weed, tree, molds, dust mites and cat    -continue allergen immunotherapy per schedule.      -stop Astelin as not effective enough    -start Atrovent nasal spray 2 sprays each nostril twice a day.   Can be used up to 4 times a day if needed for nasal drainage control.   I'm hopefully this will decrease nasal drainage, post nasal drip and throat clearing which would then lead to reduced cough    -For nasal congestion continue Flonase 2 sprays each nostril daily for 1-2 weeks at a time before stopping once nasal congestion improves for maximum benefit.   May find that you don't need this spray with use of Atrovent but you can use both if needed    -continue daily Allegra '180mg'$   Allergic asthma   -Allergen driven   -have access to albuterol inhaler 2 puffs every 4-6 hours as needed for cough/wheeze/shortness of breath/chest tightness.  May use 15-20 minutes prior to activity.   Monitor frequency of use.     - Symbicort 116mg 2 puffs twice a day to improve current control.     - Action Plan: During respiratory illnesses/flares if needed can add your Qvar 2 puffs twice a day in to Symbicort use.  Once illness is resolved and stop Qvar use    Control goals:  Full participation in all desired activities (may need albuterol before activity) Albuterol use two time or less a week on average (not counting use with activity) Cough interfering with sleep two time or less a month Oral steroids no more than once a year No hospitalizations  Shellfish allergy   -Continue avoidance of mollusks (clams, scallops, oysters)   -Have access to EpiPen as above in case of allergic reaction   -Follow emergency action plan in case of allergic reaction  Follow-up 4-6 months or sooner if needed

## 2021-11-21 ENCOUNTER — Ambulatory Visit (INDEPENDENT_AMBULATORY_CARE_PROVIDER_SITE_OTHER): Payer: Medicare PPO | Admitting: *Deleted

## 2021-11-21 DIAGNOSIS — J309 Allergic rhinitis, unspecified: Secondary | ICD-10-CM | POA: Diagnosis not present

## 2021-12-02 ENCOUNTER — Other Ambulatory Visit: Payer: Self-pay | Admitting: Physician Assistant

## 2021-12-02 DIAGNOSIS — L309 Dermatitis, unspecified: Secondary | ICD-10-CM

## 2021-12-04 ENCOUNTER — Other Ambulatory Visit: Payer: Self-pay | Admitting: Allergy

## 2021-12-12 ENCOUNTER — Ambulatory Visit (INDEPENDENT_AMBULATORY_CARE_PROVIDER_SITE_OTHER): Payer: Medicare PPO

## 2021-12-12 DIAGNOSIS — J309 Allergic rhinitis, unspecified: Secondary | ICD-10-CM

## 2021-12-19 DIAGNOSIS — J3089 Other allergic rhinitis: Secondary | ICD-10-CM | POA: Diagnosis not present

## 2021-12-19 NOTE — Progress Notes (Signed)
VIALS EXP 12-20-22

## 2021-12-20 ENCOUNTER — Encounter: Payer: Self-pay | Admitting: Obstetrics & Gynecology

## 2021-12-20 ENCOUNTER — Ambulatory Visit: Payer: Medicare PPO | Admitting: Obstetrics & Gynecology

## 2021-12-20 VITALS — BP 116/72 | HR 68 | Resp 16

## 2021-12-20 DIAGNOSIS — Z4689 Encounter for fitting and adjustment of other specified devices: Secondary | ICD-10-CM

## 2021-12-20 DIAGNOSIS — J3081 Allergic rhinitis due to animal (cat) (dog) hair and dander: Secondary | ICD-10-CM | POA: Diagnosis not present

## 2021-12-20 NOTE — Progress Notes (Signed)
    Kathryn Davis Oct 01, 1950 [id number]        71 y.o.  G2P2L2  Retired music PHD, Band professor, piano and clarinette   RP: Pessary maintenance   HPI: Well on pessary, with Milex ring #4 with support.  No vaginal bleeding.  No d/c.  No pain.  Urine/BMs normal.  Scheduled for a Rt hip replacement.     OB History  Gravida Para Term Preterm AB Living  '2 2 2     2  '$ SAB IAB Ectopic Multiple Live Births               # Outcome Date GA Lbr Len/2nd Weight Sex Delivery Anes PTL Lv  2 Term           1 Term             Past medical history,surgical history, problem list, medications, allergies, family history and social history were all reviewed and documented in the EPIC chart.   Directed ROS with pertinent positives and negatives documented in the history of present illness/assessment and plan.  Exam:  Vitals:   12/20/21 0833  BP: 116/72  Pulse: 68  Resp: 16   General appearance:  Normal    Gynecologic exam: Vulva normal.  Pessary removed and cleaned.  Vaginal mucosa intact.  Pessary easily put back in place.     Assessment/Plan:  71 y.o. G2P2L2    1. Encounter for pessary maintenance Well with Milex ring #4 with support.  Vaginal mucosa intact.  Symptoms well controlled.  Patient will follow up in 6 months for maintenance of pessary.  Will inform Orthopedist that has a pessary in place without Cx before hip replacement.   Kathryn Bruins MD, 8:57 AM 12/20/2021

## 2022-01-02 ENCOUNTER — Ambulatory Visit (INDEPENDENT_AMBULATORY_CARE_PROVIDER_SITE_OTHER): Payer: Medicare PPO

## 2022-01-02 DIAGNOSIS — J309 Allergic rhinitis, unspecified: Secondary | ICD-10-CM

## 2022-01-16 DIAGNOSIS — M26622 Arthralgia of left temporomandibular joint: Secondary | ICD-10-CM | POA: Diagnosis not present

## 2022-01-24 DIAGNOSIS — M1611 Unilateral primary osteoarthritis, right hip: Secondary | ICD-10-CM | POA: Diagnosis not present

## 2022-01-24 HISTORY — PX: TOTAL HIP ARTHROPLASTY: SHX124

## 2022-02-10 DIAGNOSIS — Z471 Aftercare following joint replacement surgery: Secondary | ICD-10-CM | POA: Diagnosis not present

## 2022-02-10 DIAGNOSIS — Z96641 Presence of right artificial hip joint: Secondary | ICD-10-CM | POA: Diagnosis not present

## 2022-02-18 ENCOUNTER — Ambulatory Visit (INDEPENDENT_AMBULATORY_CARE_PROVIDER_SITE_OTHER): Payer: Medicare PPO

## 2022-02-18 DIAGNOSIS — J309 Allergic rhinitis, unspecified: Secondary | ICD-10-CM

## 2022-03-10 DIAGNOSIS — Z471 Aftercare following joint replacement surgery: Secondary | ICD-10-CM | POA: Diagnosis not present

## 2022-03-10 DIAGNOSIS — Z96641 Presence of right artificial hip joint: Secondary | ICD-10-CM | POA: Diagnosis not present

## 2022-03-20 ENCOUNTER — Ambulatory Visit: Payer: Medicare PPO | Admitting: Physician Assistant

## 2022-03-25 ENCOUNTER — Ambulatory Visit (INDEPENDENT_AMBULATORY_CARE_PROVIDER_SITE_OTHER): Payer: Medicare PPO

## 2022-03-25 DIAGNOSIS — J309 Allergic rhinitis, unspecified: Secondary | ICD-10-CM | POA: Diagnosis not present

## 2022-04-03 ENCOUNTER — Ambulatory Visit (INDEPENDENT_AMBULATORY_CARE_PROVIDER_SITE_OTHER): Payer: Medicare PPO

## 2022-04-03 DIAGNOSIS — J309 Allergic rhinitis, unspecified: Secondary | ICD-10-CM | POA: Diagnosis not present

## 2022-04-10 ENCOUNTER — Ambulatory Visit (INDEPENDENT_AMBULATORY_CARE_PROVIDER_SITE_OTHER): Payer: Medicare PPO

## 2022-04-10 DIAGNOSIS — J309 Allergic rhinitis, unspecified: Secondary | ICD-10-CM

## 2022-04-17 ENCOUNTER — Ambulatory Visit (INDEPENDENT_AMBULATORY_CARE_PROVIDER_SITE_OTHER): Payer: Medicare PPO

## 2022-04-17 DIAGNOSIS — J309 Allergic rhinitis, unspecified: Secondary | ICD-10-CM

## 2022-04-24 ENCOUNTER — Ambulatory Visit (INDEPENDENT_AMBULATORY_CARE_PROVIDER_SITE_OTHER): Payer: Medicare PPO

## 2022-04-24 DIAGNOSIS — J309 Allergic rhinitis, unspecified: Secondary | ICD-10-CM | POA: Diagnosis not present

## 2022-05-01 ENCOUNTER — Other Ambulatory Visit: Payer: Self-pay | Admitting: Family Medicine

## 2022-05-01 ENCOUNTER — Ambulatory Visit
Admission: RE | Admit: 2022-05-01 | Discharge: 2022-05-01 | Disposition: A | Discharge status: Home / Self Care | Payer: Medicare PPO | Source: Ambulatory Visit | Attending: Family Medicine | Admitting: Family Medicine

## 2022-05-01 DIAGNOSIS — N6320 Unspecified lump in the left breast, unspecified quadrant: Secondary | ICD-10-CM

## 2022-05-01 DIAGNOSIS — N6321 Unspecified lump in the left breast, upper outer quadrant: Secondary | ICD-10-CM | POA: Diagnosis not present

## 2022-05-01 DIAGNOSIS — R922 Inconclusive mammogram: Secondary | ICD-10-CM | POA: Diagnosis not present

## 2022-05-07 ENCOUNTER — Telehealth: Payer: Self-pay | Admitting: *Deleted

## 2022-05-07 ENCOUNTER — Ambulatory Visit: Payer: Medicare PPO | Admitting: Allergy

## 2022-05-07 ENCOUNTER — Encounter: Payer: Self-pay | Admitting: Allergy

## 2022-05-07 VITALS — BP 140/66 | HR 67 | Temp 98.6°F | Resp 12

## 2022-05-07 DIAGNOSIS — H1013 Acute atopic conjunctivitis, bilateral: Secondary | ICD-10-CM

## 2022-05-07 DIAGNOSIS — T7800XD Anaphylactic reaction due to unspecified food, subsequent encounter: Secondary | ICD-10-CM

## 2022-05-07 DIAGNOSIS — J3089 Other allergic rhinitis: Secondary | ICD-10-CM | POA: Diagnosis not present

## 2022-05-07 DIAGNOSIS — J302 Other seasonal allergic rhinitis: Secondary | ICD-10-CM

## 2022-05-07 DIAGNOSIS — J4520 Mild intermittent asthma, uncomplicated: Secondary | ICD-10-CM | POA: Diagnosis not present

## 2022-05-07 MED ORDER — SYMBICORT 160-4.5 MCG/ACT IN AERO: INHALATION_SPRAY | RESPIRATORY_TRACT | 1 refills | Status: DC

## 2022-05-07 MED ORDER — EPINEPHRINE 0.3 MG/0.3ML IJ SOAJ: INTRAMUSCULAR | 1 refills | Status: AC

## 2022-05-07 MED ORDER — ALBUTEROL SULFATE HFA 108 (90 BASE) MCG/ACT IN AERS: 2.0000 | INHALATION_SPRAY | RESPIRATORY_TRACT | 1 refills | Status: DC | PRN

## 2022-05-07 NOTE — Telephone Encounter (Signed)
Patient would like to transfer her Allergy vials from [facility] to [place] to continue her injections. Lachelle can you please help arrange to bring her vials over for next week?

## 2022-05-07 NOTE — Progress Notes (Signed)
Follow-up Note  RE: Kathryn Davis MRN: [id number] DOB: 1951-03-25 Date of Office Visit: 05/07/2022   History of present illness: Kathryn Davis is a 72 y.o. female presenting today for follow-up of allergic rhinitis with conjunctivitis, allergic asthma and shellfish allergy.  She was last seen in the office on 11/01/21 by myself.  She states she is doing better at the time.  She reports she does vacuum daily at home and keeps home clean.  She does have improvement in nasal drainage control with use of Atrovent and does not note needing to use flonase as much with atrovent use.  However she does still note some chest congestion/tightness that seems to occur twice a day around 3 in afternoon and again in late evenings.  She does use symbicort twice a day but not around these times she is noting symptoms.  She does not use albuterol for this.  She has had not any need for UC/ED visits or systemic steroid needs.  She does continue on allergen immunotherapy currently at every 3 week dosing and tolerating injections well for most part.  She does not toward the end of the vial she will have most localized itch and red area that does resolve by next day.   She does continue to avoid shellfish without accidental ingestion or need to use epinephrine.  She states she did have to stop chewing gum as it started to cause TMJ issues.   Review of systems: Review of Systems  Constitutional: Negative.   HENT: Negative.    Eyes: Negative.   Respiratory:         See HPI  Cardiovascular: Negative.   Gastrointestinal: Negative.   Musculoskeletal: Negative.   Skin: Negative.   Allergic/Immunologic: Negative.   Neurological: Negative.      All other systems negative unless noted above in HPI  Past medical/social/surgical/family history have been reviewed and are unchanged unless specifically indicated below.  No changes  Medication List: Current Outpatient Medications  Medication Sig  Dispense Refill   atorvastatin (LIPITOR) 10 MG tablet Take 10 mg by mouth daily.     citalopram (CELEXA) 10 MG tablet Take 10 mg by mouth daily.     fluticasone (FLONASE) 50 MCG/ACT nasal spray Place into both nostrils daily.     ipratropium (ATROVENT) 0.06 % nasal spray Place 2 sprays into both nostrils 2 (two) times daily. 45 mL 2   Multiple Vitamin (MULTIVITAMIN PO) Take 1 tablet by mouth daily.     NON FORMULARY ALLERGY INJECTIONS     Spacer/Aero-Holding Dorise Bullion Use as directed. 1 each 1   triamcinolone cream (KENALOG) 0.1 % APPLY 1 APPLICATION TOPICALLY DAILY AS NEEDED. 454 g 2   albuterol (VENTOLIN HFA) 108 (90 Base) MCG/ACT inhaler Inhale 2 puffs into the lungs every 4 (four) hours as needed for wheezing or shortness of breath. 54 g 1   EPINEPHrine (EPIPEN 2-PAK) 0.3 mg/0.3 mL IJ SOAJ injection Use as directed for severe allergic reaction 1 each 1   SYMBICORT 160-4.5 MCG/ACT inhaler 2 PUFFS TWICE DAILY WITH SPACER TO PREVENT COUGHING OR WHEEZING. 30.6 g 1   No current facility-administered medications for this visit.     Known medication allergies: Allergies  Allergen Reactions   Molds & Smuts     Other reaction(s): Cough   Shellfish Allergy Nausea And Vomiting     Physical examination: Blood pressure (!) 140/66, pulse 67, temperature 98.6 F (37 C), temperature source Temporal, resp. rate 12, SpO2 95 %.  General: Alert, interactive, in no acute distress. HEENT: PERRLA, TMs pearly gray, turbinates minimally edematous without discharge, post-pharynx non erythematous. Neck: Supple without lymphadenopathy. Lungs: Clear to auscultation without wheezing, rhonchi or rales. {no increased work of breathing. CV: Normal S1, S2 without murmurs. Abdomen: Nondistended, nontender. Skin: Warm and dry, without lesions or rashes. Extremities:  No clubbing, cyanosis or edema. Neuro:   Grossly intact.  Diagnositics/Labs: None today  Assessment and plan:   Allergic rhinitis with  conjunctivitis    -continue avoidance measures to grass, weed, tree, molds, dust mites and cat    -continue allergen immunotherapy per schedule.  Will transfer your vials to [place] office for next injection    -use Atrovent nasal spray 2 sprays each nostril twice a day.  Can be used up to 4 times a day if needed for nasal drainage control.   May not need to use Flonase when using Atrovent.      -continue daily Allegra '180mg'$  as needed.  Take prior to your injections when you are reaching end of the vial  Allergic asthma   -Allergen driven   -have access to albuterol inhaler 2 puffs every 4-6 hours as needed for cough/wheeze/shortness of breath/chest tightness.  May use 15-20 minutes prior to activity.   Monitor frequency of use.     - Symbicort 159mg 2 puffs twice a day.  Can take additional dose midday.  Symbicort can be use as a maintenance and rescue medication (max puffs/day is 10-12).    - Action Plan: During respiratory illnesses/flares if needed can add Qvar 2 puffs twice a day in to Symbicort use.  Once illness is resolved can stop Qvar use  - will plan to get spirometry at next visit    Control goals:  Full participation in all desired activities (may need albuterol before activity) Albuterol use two time or less a week on average (not counting use with activity) Cough interfering with sleep two time or less a month Oral steroids no more than once a year No hospitalizations  Shellfish allergy   -Continue avoidance of mollusks (clams, scallops, oysters)   -Have access to EpiPen as above in case of allergic reaction   -Follow emergency action plan in case of allergic reaction  Follow-up 6 months or sooner if needed  I appreciate the opportunity to take part in [facility] [place]. Please do not hesitate to contact me with questions.  Sincerely,   SPrudy Feeler MD Allergy/Immunology [facility]

## 2022-05-07 NOTE — Patient Instructions (Addendum)
Allergic rhinitis with conjunctivitis    -continue avoidance measures to grass, weed, tree, molds, dust mites and cat    -continue allergen immunotherapy per schedule.  Will transfer your vials to [place] office for next injection    -use Atrovent nasal spray 2 sprays each nostril twice a day.  Can be used up to 4 times a day if needed for nasal drainage control.   May not need to use Flonase when using Atrovent.      -continue daily Allegra '180mg'$  as needed.  Take prior to your injections when you are reaching end of the vial  Allergic asthma   -Allergen driven   -have access to albuterol inhaler 2 puffs every 4-6 hours as needed for cough/wheeze/shortness of breath/chest tightness.  May use 15-20 minutes prior to activity.   Monitor frequency of use.     - Symbicort 145mg 2 puffs twice a day.  Can take additional dose midday.  Symbicort can be use as a maintenance and rescue medication (max puffs/day is 10-12).    - Action Plan: During respiratory illnesses/flares if needed can add Qvar 2 puffs twice a day in to Symbicort use.  Once illness is resolved can stop Qvar use    Control goals:  Full participation in all desired activities (may need albuterol before activity) Albuterol use two time or less a week on average (not counting use with activity) Cough interfering with sleep two time or less a month Oral steroids no more than once a year No hospitalizations  Shellfish allergy   -Continue avoidance of mollusks (clams, scallops, oysters)   -Have access to EpiPen as above in case of allergic reaction   -Follow emergency action plan in case of allergic reaction  Follow-up 6 months or sooner if needed

## 2022-05-08 NOTE — Telephone Encounter (Signed)
I have packed Kathryn Davis's vials up and ready to send to [facility]

## 2022-05-08 NOTE — Telephone Encounter (Signed)
Thank You, Lachelle!

## 2022-05-15 ENCOUNTER — Ambulatory Visit (INDEPENDENT_AMBULATORY_CARE_PROVIDER_SITE_OTHER): Payer: Medicare PPO

## 2022-05-15 DIAGNOSIS — J309 Allergic rhinitis, unspecified: Secondary | ICD-10-CM

## 2022-05-23 ENCOUNTER — Other Ambulatory Visit: Payer: Self-pay | Admitting: Allergy

## 2022-05-23 NOTE — Telephone Encounter (Signed)
Pts insurance does not cover qvar what would you want to try next

## 2022-05-23 NOTE — Telephone Encounter (Signed)
New prescription has been sent in. Called patient and left a detailed voicemail advising per [facility] permission.

## 2022-05-23 NOTE — Telephone Encounter (Signed)
Flovent and pulmicort is lookin like its covered

## 2022-06-05 ENCOUNTER — Ambulatory Visit (INDEPENDENT_AMBULATORY_CARE_PROVIDER_SITE_OTHER): Payer: Medicare PPO

## 2022-06-05 DIAGNOSIS — J309 Allergic rhinitis, unspecified: Secondary | ICD-10-CM | POA: Diagnosis not present

## 2022-06-12 ENCOUNTER — Other Ambulatory Visit: Payer: Self-pay | Admitting: Allergy

## 2022-06-20 ENCOUNTER — Encounter: Payer: Self-pay | Admitting: Obstetrics & Gynecology

## 2022-06-20 ENCOUNTER — Ambulatory Visit: Payer: Medicare PPO | Admitting: Obstetrics & Gynecology

## 2022-06-20 VITALS — BP 112/72 | HR 89

## 2022-06-20 DIAGNOSIS — Z4689 Encounter for fitting and adjustment of other specified devices: Secondary | ICD-10-CM | POA: Diagnosis not present

## 2022-06-20 NOTE — Progress Notes (Signed)
    ALYNE MARTINSON [birth year] [id number]        72 y.o. G2P2L2  Retired music PHD, Band professor, piano and clarinette   RP: Pessary maintenance   HPI: Well on pessary, with Milex ring #4 with support.  No vaginal bleeding.  No d/c.  No pain.  Urine/BMs normal.  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    OB History  Gravida Para Term Preterm AB Living  2 2 2     2   SAB IAB Ectopic Multiple Live Births               # Outcome Date GA Lbr Len/2nd Weight Sex Delivery Anes PTL Lv  2 Term           1 Term             Past medical history,surgical history, problem list, medications, allergies, family history and social history were all reviewed and documented in the EPIC chart.   Directed ROS with pertinent positives and negatives documented in the history of present illness/assessment and plan.  Exam:  Vitals:   06/20/22 0857  BP: 112/72  Pulse: 89  SpO2: 97%   General appearance:  Normal  Gynecologic exam: Vulva normal.  Pessary removed and cleaned.  Vaginal mucosa intact.  Pessary easily put back in place.     Assessment/Plan:  72 y.o. G2P2L2    1. Encounter for pessary maintenance Well with Milex ring #4 with support.  Vaginal mucosa intact.  Symptoms well controlled. Patient will follow up in 3-4 months for maintenance of pessary.    [name] MD, 9:16 AM 06/20/2022

## 2022-06-27 ENCOUNTER — Ambulatory Visit (INDEPENDENT_AMBULATORY_CARE_PROVIDER_SITE_OTHER): Payer: Medicare PPO

## 2022-06-27 DIAGNOSIS — J309 Allergic rhinitis, unspecified: Secondary | ICD-10-CM

## 2022-07-18 ENCOUNTER — Ambulatory Visit (INDEPENDENT_AMBULATORY_CARE_PROVIDER_SITE_OTHER): Payer: Medicare PPO

## 2022-07-18 DIAGNOSIS — J309 Allergic rhinitis, unspecified: Secondary | ICD-10-CM | POA: Diagnosis not present

## 2022-08-08 ENCOUNTER — Ambulatory Visit (INDEPENDENT_AMBULATORY_CARE_PROVIDER_SITE_OTHER): Payer: Medicare PPO

## 2022-08-08 DIAGNOSIS — J309 Allergic rhinitis, unspecified: Secondary | ICD-10-CM | POA: Diagnosis not present

## 2022-08-11 DIAGNOSIS — Z96641 Presence of right artificial hip joint: Secondary | ICD-10-CM | POA: Diagnosis not present

## 2022-08-13 DIAGNOSIS — J3089 Other allergic rhinitis: Secondary | ICD-10-CM | POA: Diagnosis not present

## 2022-08-13 NOTE — Progress Notes (Signed)
VIALS EXP 08-13-23 

## 2022-08-14 DIAGNOSIS — J3081 Allergic rhinitis due to animal (cat) (dog) hair and dander: Secondary | ICD-10-CM | POA: Diagnosis not present

## 2022-08-19 DIAGNOSIS — L570 Actinic keratosis: Secondary | ICD-10-CM | POA: Diagnosis not present

## 2022-08-19 DIAGNOSIS — L739 Follicular disorder, unspecified: Secondary | ICD-10-CM | POA: Diagnosis not present

## 2022-08-19 DIAGNOSIS — L568 Other specified acute skin changes due to ultraviolet radiation: Secondary | ICD-10-CM | POA: Diagnosis not present

## 2022-08-19 DIAGNOSIS — D485 Neoplasm of uncertain behavior of skin: Secondary | ICD-10-CM | POA: Diagnosis not present

## 2022-08-19 DIAGNOSIS — D2239 Melanocytic nevi of other parts of face: Secondary | ICD-10-CM | POA: Diagnosis not present

## 2022-08-19 DIAGNOSIS — Z1283 Encounter for screening for malignant neoplasm of skin: Secondary | ICD-10-CM | POA: Diagnosis not present

## 2022-08-27 ENCOUNTER — Ambulatory Visit (INDEPENDENT_AMBULATORY_CARE_PROVIDER_SITE_OTHER): Payer: Medicare PPO | Admitting: *Deleted

## 2022-08-27 DIAGNOSIS — J309 Allergic rhinitis, unspecified: Secondary | ICD-10-CM

## 2022-09-01 DIAGNOSIS — L089 Local infection of the skin and subcutaneous tissue, unspecified: Secondary | ICD-10-CM | POA: Diagnosis not present

## 2022-09-01 DIAGNOSIS — Z6830 Body mass index (BMI) 30.0-30.9, adult: Secondary | ICD-10-CM | POA: Diagnosis not present

## 2022-09-25 ENCOUNTER — Ambulatory Visit (INDEPENDENT_AMBULATORY_CARE_PROVIDER_SITE_OTHER): Payer: Medicare PPO

## 2022-09-25 DIAGNOSIS — J309 Allergic rhinitis, unspecified: Secondary | ICD-10-CM

## 2022-10-03 ENCOUNTER — Ambulatory Visit (INDEPENDENT_AMBULATORY_CARE_PROVIDER_SITE_OTHER): Payer: Medicare PPO

## 2022-10-03 DIAGNOSIS — J309 Allergic rhinitis, unspecified: Secondary | ICD-10-CM

## 2022-10-10 ENCOUNTER — Ambulatory Visit: Payer: Medicare PPO | Admitting: Obstetrics & Gynecology

## 2022-10-10 ENCOUNTER — Encounter: Payer: Self-pay | Admitting: Obstetrics & Gynecology

## 2022-10-10 ENCOUNTER — Ambulatory Visit (INDEPENDENT_AMBULATORY_CARE_PROVIDER_SITE_OTHER): Payer: Medicare PPO | Admitting: *Deleted

## 2022-10-10 VITALS — BP 130/82 | HR 67

## 2022-10-10 DIAGNOSIS — Z4689 Encounter for fitting and adjustment of other specified devices: Secondary | ICD-10-CM

## 2022-10-10 DIAGNOSIS — J309 Allergic rhinitis, unspecified: Secondary | ICD-10-CM | POA: Diagnosis not present

## 2022-10-10 NOTE — Progress Notes (Signed)
    Kathryn Davis Feb 26, 1951 [id number]        72 y.o.  G2P2L2  Retired music PHD, Band professor, piano and clarinette   RP: Pessary maintenance   HPI: Well on pessary, with Milex ring #4 with support.  No vaginal bleeding.  No d/c.  No pain.  Urine/BMs normal.  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   OB History  Gravida Para Term Preterm AB Living  2 2 2     2   SAB IAB Ectopic Multiple Live Births               # Outcome Date GA Lbr Len/2nd Weight Sex Delivery Anes PTL Lv  2 Term           1 Term             Past medical history,surgical history, problem list, medications, allergies, family history and social history were all reviewed and documented in the EPIC chart.   Directed ROS with pertinent positives and negatives documented in the history of present illness/assessment and plan.  Exam:  Vitals:   10/10/22 0856  BP: 130/82  Pulse: 67  SpO2: 93%   General appearance:  Normal  Gynecologic exam: Vulva normal.  Pessary removed and cleaned. Vaginal mucosa intact.  Pessary easily put back in place.     Assessment/Plan:  72 y.o. G2P2L2    1. Encounter for pessary maintenance Well with Milex ring #4 with support.  Vaginal mucosa intact.  Symptoms well controlled. Patient will follow up in 4 months for Breast and Pelvic and maintenance of pessary.    Genia Del MD, 9:13 AM 10/10/2022

## 2022-10-15 ENCOUNTER — Ambulatory Visit (INDEPENDENT_AMBULATORY_CARE_PROVIDER_SITE_OTHER): Payer: Medicare PPO | Admitting: *Deleted

## 2022-10-15 DIAGNOSIS — J309 Allergic rhinitis, unspecified: Secondary | ICD-10-CM | POA: Diagnosis not present

## 2022-10-24 DIAGNOSIS — E782 Mixed hyperlipidemia: Secondary | ICD-10-CM | POA: Diagnosis not present

## 2022-10-24 DIAGNOSIS — R7303 Prediabetes: Secondary | ICD-10-CM | POA: Diagnosis not present

## 2022-10-24 DIAGNOSIS — E559 Vitamin D deficiency, unspecified: Secondary | ICD-10-CM | POA: Diagnosis not present

## 2022-10-28 DIAGNOSIS — Z1389 Encounter for screening for other disorder: Secondary | ICD-10-CM | POA: Diagnosis not present

## 2022-10-28 DIAGNOSIS — E669 Obesity, unspecified: Secondary | ICD-10-CM | POA: Diagnosis not present

## 2022-10-28 DIAGNOSIS — Z9181 History of falling: Secondary | ICD-10-CM | POA: Diagnosis not present

## 2022-10-28 DIAGNOSIS — Z0000 Encounter for general adult medical examination without abnormal findings: Secondary | ICD-10-CM | POA: Diagnosis not present

## 2022-10-29 ENCOUNTER — Ambulatory Visit (INDEPENDENT_AMBULATORY_CARE_PROVIDER_SITE_OTHER): Payer: Medicare PPO | Admitting: *Deleted

## 2022-10-29 DIAGNOSIS — J309 Allergic rhinitis, unspecified: Secondary | ICD-10-CM

## 2022-10-31 ENCOUNTER — Ambulatory Visit
Admission: RE | Admit: 2022-10-31 | Discharge: 2022-10-31 | Disposition: A | Discharge status: Home / Self Care | Payer: Medicare PPO | Source: Ambulatory Visit | Attending: Family Medicine | Admitting: Family Medicine

## 2022-10-31 DIAGNOSIS — N6320 Unspecified lump in the left breast, unspecified quadrant: Secondary | ICD-10-CM

## 2022-10-31 DIAGNOSIS — N6012 Diffuse cystic mastopathy of left breast: Secondary | ICD-10-CM | POA: Diagnosis not present

## 2022-11-04 DIAGNOSIS — E782 Mixed hyperlipidemia: Secondary | ICD-10-CM | POA: Diagnosis not present

## 2022-11-04 DIAGNOSIS — R7303 Prediabetes: Secondary | ICD-10-CM | POA: Diagnosis not present

## 2022-11-04 DIAGNOSIS — Z6830 Body mass index (BMI) 30.0-30.9, adult: Secondary | ICD-10-CM | POA: Diagnosis not present

## 2022-11-04 DIAGNOSIS — E559 Vitamin D deficiency, unspecified: Secondary | ICD-10-CM | POA: Diagnosis not present

## 2022-11-04 DIAGNOSIS — F419 Anxiety disorder, unspecified: Secondary | ICD-10-CM | POA: Diagnosis not present

## 2022-11-06 ENCOUNTER — Ambulatory Visit (INDEPENDENT_AMBULATORY_CARE_PROVIDER_SITE_OTHER): Payer: Medicare PPO

## 2022-11-06 DIAGNOSIS — J309 Allergic rhinitis, unspecified: Secondary | ICD-10-CM

## 2022-12-01 ENCOUNTER — Ambulatory Visit (INDEPENDENT_AMBULATORY_CARE_PROVIDER_SITE_OTHER): Payer: Medicare PPO | Admitting: *Deleted

## 2022-12-01 DIAGNOSIS — J309 Allergic rhinitis, unspecified: Secondary | ICD-10-CM

## 2022-12-30 ENCOUNTER — Ambulatory Visit (INDEPENDENT_AMBULATORY_CARE_PROVIDER_SITE_OTHER): Payer: Self-pay | Admitting: *Deleted

## 2022-12-30 DIAGNOSIS — J309 Allergic rhinitis, unspecified: Secondary | ICD-10-CM | POA: Diagnosis not present

## 2023-01-27 ENCOUNTER — Ambulatory Visit (INDEPENDENT_AMBULATORY_CARE_PROVIDER_SITE_OTHER): Payer: Self-pay | Admitting: *Deleted

## 2023-01-27 DIAGNOSIS — J309 Allergic rhinitis, unspecified: Secondary | ICD-10-CM

## 2023-02-07 IMAGING — MG MM DIGITAL SCREENING BILAT W/ TOMO AND CAD
8 series · 8 of 24 positions shown · non-contrast
Comparison: Previous exam(s).

CLINICAL DATA: Screening.

EXAM:
DIGITAL SCREENING BILATERAL MAMMOGRAM WITH TOMOSYNTHESIS AND CAD
TECHNIQUE: Bilateral screening digital craniocaudal and mediolateral oblique
mammograms were obtained. Bilateral screening digital breast
tomosynthesis was performed. The images were evaluated with
computer-aided detection.

[R MLO synth-2D]
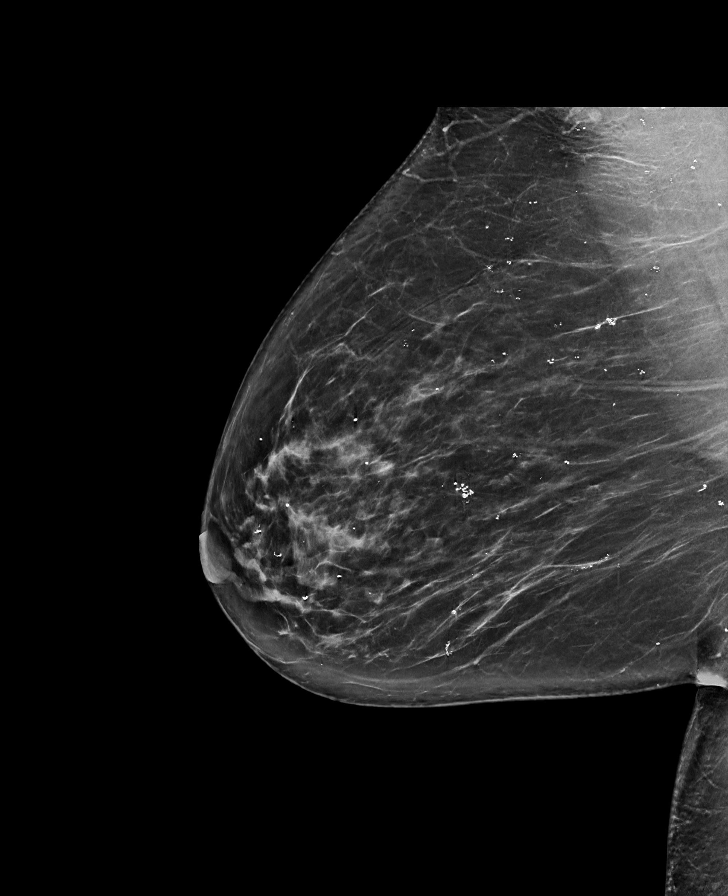

[L MLO synth-2D]
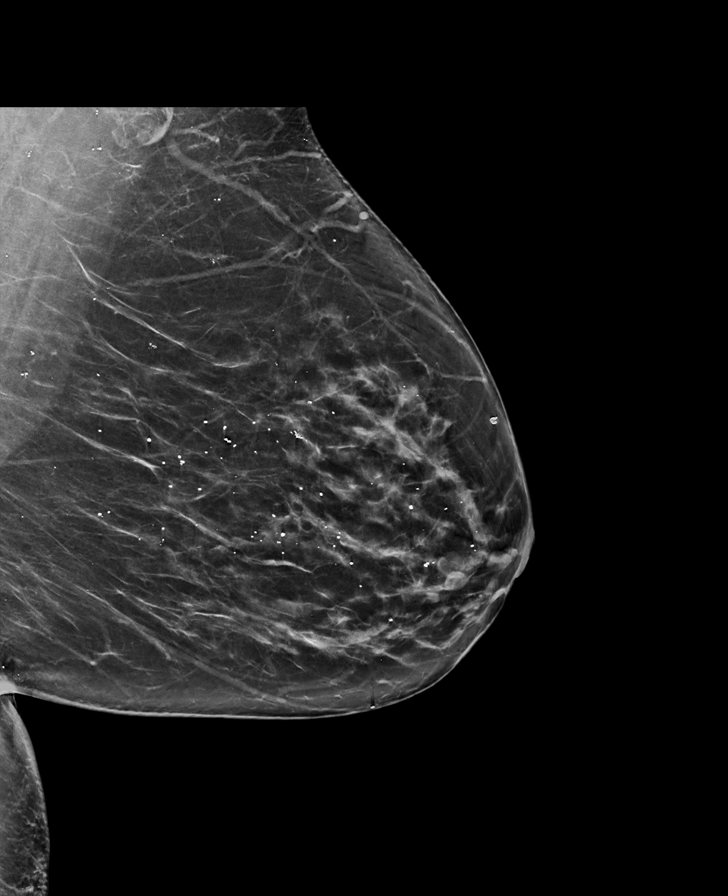

[R CC synth-2D]
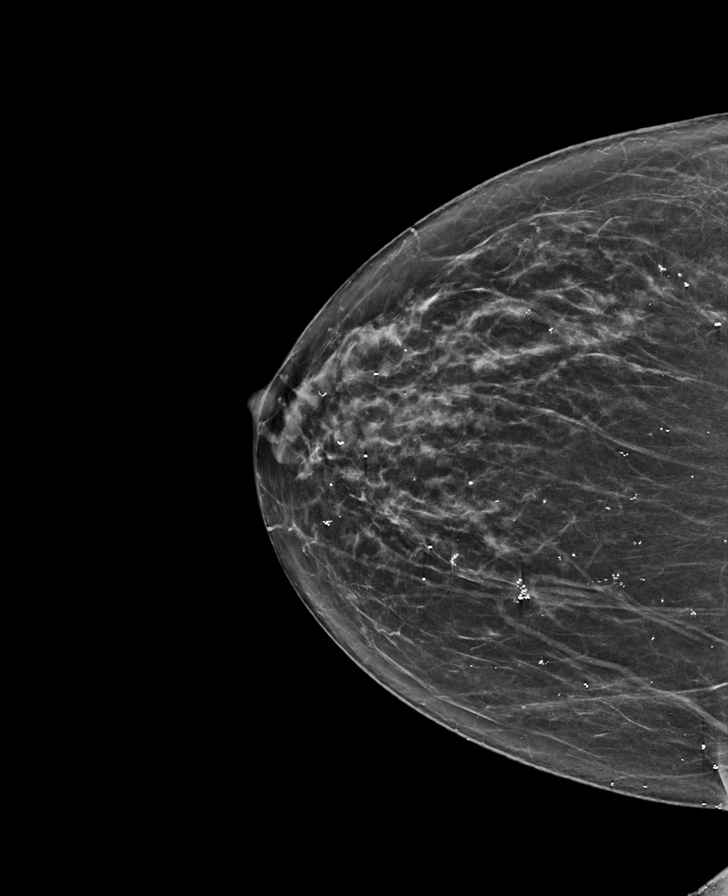

[L CC synth-2D]
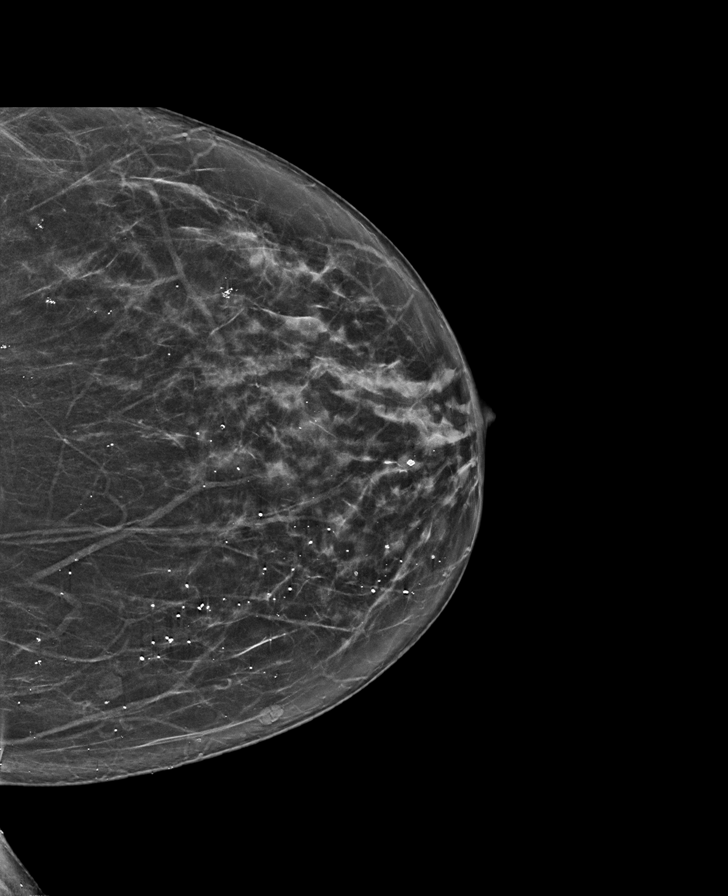

[L CC tomo · tomo slice 33/65.0]
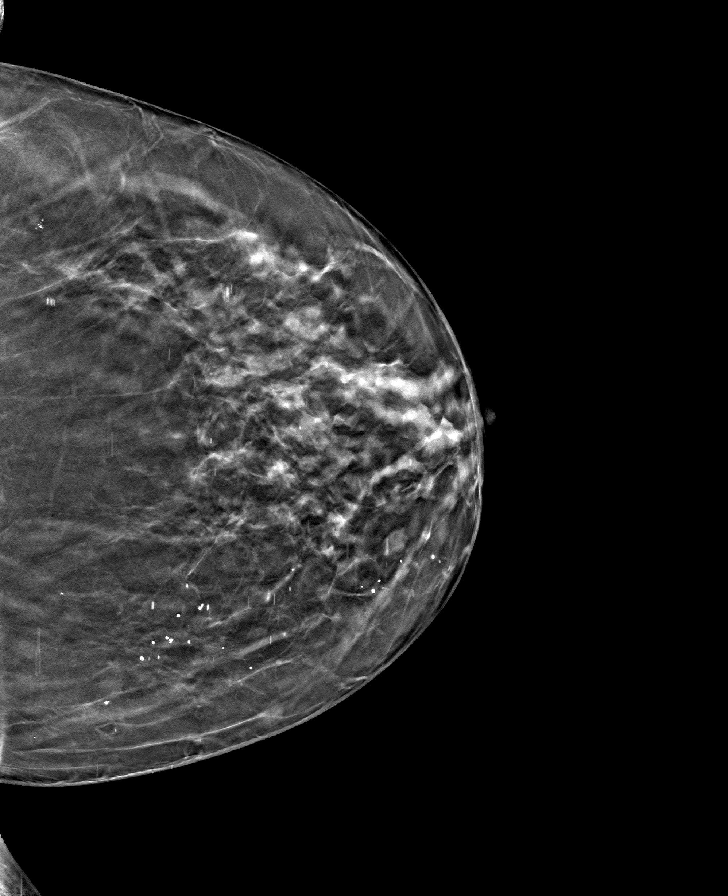

[L MLO tomo · tomo slice 39/76.0]
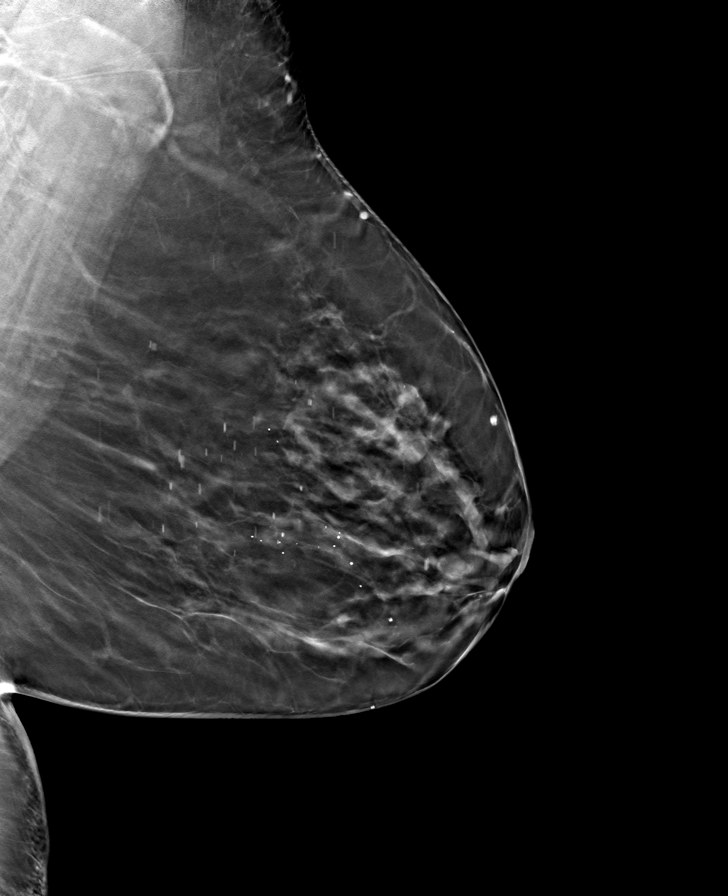

[R MLO tomo · tomo slice 41/81.0]
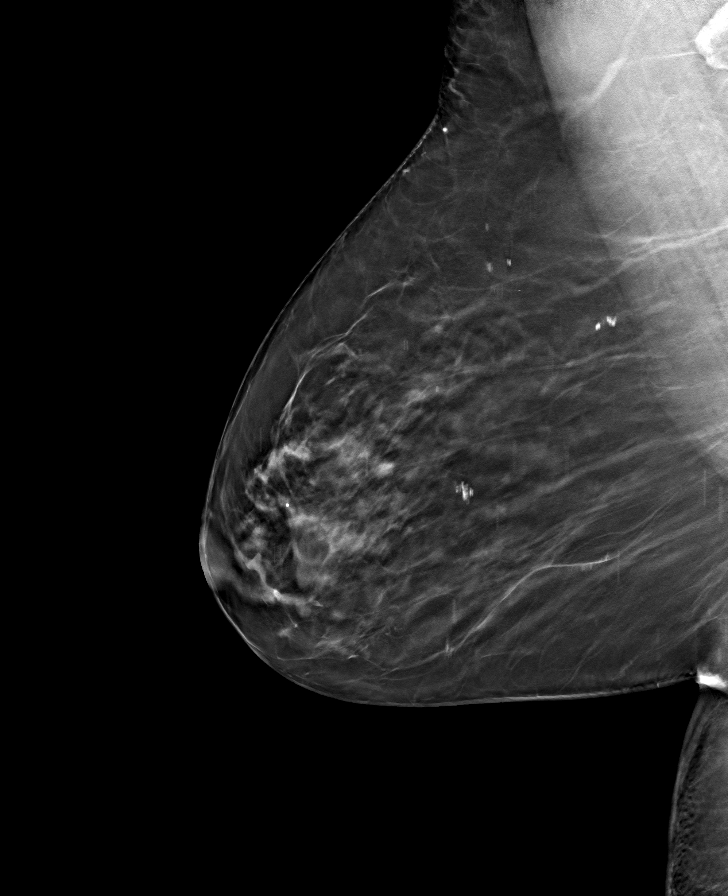

[R CC tomo · tomo slice 33/65.0]
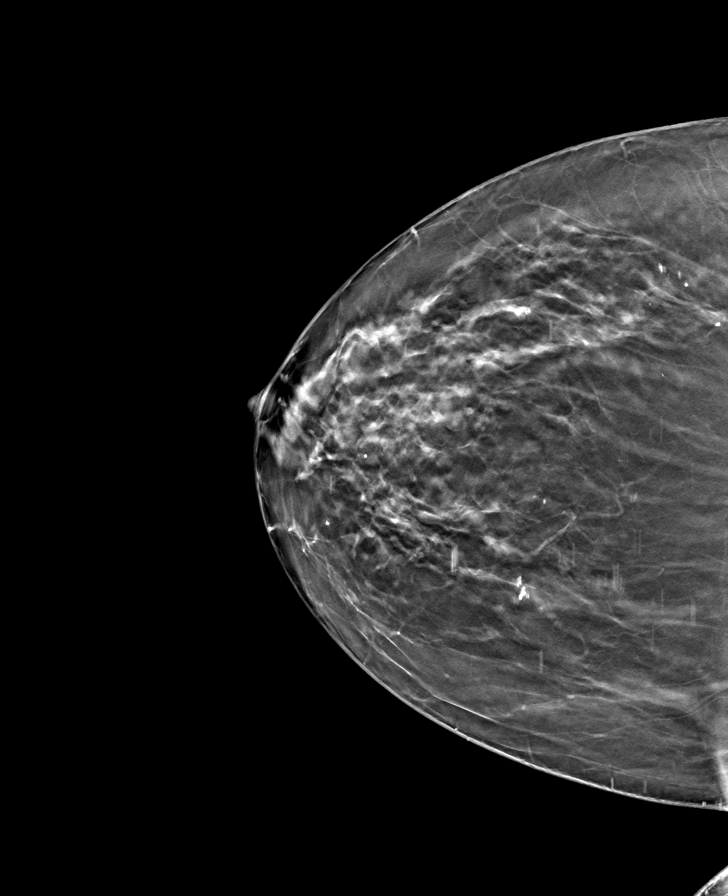

[8 of 24 positions shown; findings below may reference images not displayed]

ACR Breast Density Category c: The breast tissue is heterogeneously
dense, which may obscure small masses.
FINDINGS: There are no findings suspicious for malignancy. The images were
evaluated with computer-aided detection.
IMPRESSION: No mammographic evidence of malignancy. A result letter of this
screening mammogram will be mailed directly to the patient.

RECOMMENDATION:
Screening mammogram in one year. (Code:T4-5-GWO)

BI-RADS CATEGORY  1: Negative.

## 2023-02-12 ENCOUNTER — Encounter: Payer: Self-pay | Admitting: Allergy

## 2023-02-12 ENCOUNTER — Other Ambulatory Visit: Payer: Self-pay

## 2023-02-12 MED ORDER — SYMBICORT 160-4.5 MCG/ACT IN AERO: INHALATION_SPRAY | RESPIRATORY_TRACT | 0 refills | Status: DC

## 2023-02-25 ENCOUNTER — Ambulatory Visit (INDEPENDENT_AMBULATORY_CARE_PROVIDER_SITE_OTHER): Payer: Self-pay | Admitting: *Deleted

## 2023-02-25 ENCOUNTER — Encounter: Payer: Self-pay | Admitting: Radiology

## 2023-02-25 ENCOUNTER — Ambulatory Visit: Payer: Medicare PPO | Admitting: Radiology

## 2023-02-25 VITALS — BP 134/78 | Ht 63.5 in | Wt 157.0 lb

## 2023-02-25 DIAGNOSIS — Z4689 Encounter for fitting and adjustment of other specified devices: Secondary | ICD-10-CM | POA: Diagnosis not present

## 2023-02-25 DIAGNOSIS — N760 Acute vaginitis: Secondary | ICD-10-CM | POA: Diagnosis not present

## 2023-02-25 DIAGNOSIS — J309 Allergic rhinitis, unspecified: Secondary | ICD-10-CM

## 2023-02-25 DIAGNOSIS — T8369XA Infection and inflammatory reaction due to other prosthetic device, implant and graft in genital tract, initial encounter: Secondary | ICD-10-CM | POA: Diagnosis not present

## 2023-02-25 MED ORDER — ESTRADIOL 0.1 MG/GM VA CREA: 1.0000 g | TOPICAL_CREAM | VAGINAL | 1 refills | Status: DC

## 2023-02-25 NOTE — Progress Notes (Signed)
   Subjective:     History of Present Illness: Kathryn Davis is a 72 y.o. female with stage III pelvic organ prolapse who presents for a pessary check. She is using a size 4 ring with support pessary. The pessary has been working well and she has no complaints. She is not using vaginal estrogen. She denies vaginal bleeding.  Past Medical History: Patient  has a past medical history of Asthma, Cystocele, Osteoarthritis, Urinary incontinence, Uterine prolapse, and Vaginal pessary present.   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   Medications: She has a current medication list which includes the following prescription(s): atorvastatin, citalopram, elderberry, epinephrine, [START ON 02/26/2023] estradiol, ipratropium, multiple vitamin, NON FORMULARY, spacer/aero-holding chambers, symbicort, triamcinolone cream, albuterol, fluticasone, and fluticasone.   Allergies: Patient is allergic to molds & smuts and shellfish allergy.       Objective:    Physical Exam: BP 134/78 (BP Location: Right Arm, Cuff Size: Normal)   Ht 5' 3.5" (1.613 m)   Wt 157 lb (71.2 kg)   BMI 27.38 kg/m  Gen: No apparent distress, A&O x 3. Detailed Urogynecologic Evaluation:  Pelvic Exam: Normal external female genitalia; Bartholin's and Skene's glands normal in appearance; urethral meatus normal in appearance, no urethral masses or discharge. The pessary was noted to be in place. It was removed and cleaned. Speculum exam revealed erythema in the vagina. The pessary was replaced. It was comfortable to the patient and fit well.     Assessment/Plan:    1. Pessary maintenance  2. Vaginal inflammation from pessary, initial encounter (HCC) Restart estrace - estradiol (ESTRACE VAGINAL) 0.1 MG/GM vaginal cream; Place 1 g vaginally 2 (two) times a week.  Dispense: 42.5 g; Refill: 1     She will keep the pessary in place until next visit. She will continue to use estrogen. She will follow-up in 3 months for a pessary check or sooner as needed.  All questions were answered.  Veretta Sabourin B, NP 11:17 AM

## 2023-03-03 DIAGNOSIS — J3089 Other allergic rhinitis: Secondary | ICD-10-CM | POA: Diagnosis not present

## 2023-03-03 NOTE — Progress Notes (Signed)
VIALS EXP 03-02-24

## 2023-03-04 DIAGNOSIS — J3081 Allergic rhinitis due to animal (cat) (dog) hair and dander: Secondary | ICD-10-CM | POA: Diagnosis not present

## 2023-03-11 ENCOUNTER — Other Ambulatory Visit: Payer: Self-pay | Admitting: Allergy

## 2023-03-20 ENCOUNTER — Encounter: Payer: Self-pay | Admitting: Allergy

## 2023-03-20 ENCOUNTER — Other Ambulatory Visit: Payer: Self-pay

## 2023-03-20 ENCOUNTER — Ambulatory Visit: Payer: Medicare PPO | Admitting: Allergy

## 2023-03-20 VITALS — BP 132/70 | HR 63 | Temp 97.3°F | Resp 12 | Wt 153.6 lb

## 2023-03-20 DIAGNOSIS — T7800XD Anaphylactic reaction due to unspecified food, subsequent encounter: Secondary | ICD-10-CM | POA: Diagnosis not present

## 2023-03-20 DIAGNOSIS — J3089 Other allergic rhinitis: Secondary | ICD-10-CM

## 2023-03-20 DIAGNOSIS — J4520 Mild intermittent asthma, uncomplicated: Secondary | ICD-10-CM

## 2023-03-20 DIAGNOSIS — J302 Other seasonal allergic rhinitis: Secondary | ICD-10-CM | POA: Diagnosis not present

## 2023-03-20 DIAGNOSIS — H1013 Acute atopic conjunctivitis, bilateral: Secondary | ICD-10-CM

## 2023-03-20 MED ORDER — ALBUTEROL SULFATE HFA 108 (90 BASE) MCG/ACT IN AERS: 2.0000 | INHALATION_SPRAY | RESPIRATORY_TRACT | 1 refills | Status: DC | PRN

## 2023-03-20 MED ORDER — IPRATROPIUM BROMIDE 0.06 % NA SOLN: 2.0000 | Freq: Two times a day (BID) | NASAL | 5 refills | Status: DC

## 2023-03-20 MED ORDER — SYMBICORT 160-4.5 MCG/ACT IN AERO: INHALATION_SPRAY | RESPIRATORY_TRACT | 5 refills | Status: DC

## 2023-03-20 NOTE — Patient Instructions (Addendum)
Allergic rhinitis with conjunctivitis    -continue avoidance measures to grass, weed, tree, molds, dust mites and cat    -continue allergen immunotherapy per schedule.      -use Atrovent nasal spray 2 sprays each nostril daily.  Can be used up to 4 times a day if needed for nasal drainage control.       -continue daily Allegra 180mg  as needed.   Allergic asthma   -Allergen driven   -have access to albuterol inhaler 2 puffs every 4-6 hours as needed for cough/wheeze/shortness of breath/chest tightness.  May use 15-20 minutes prior to activity.   Monitor frequency of use.     - Symbicort 2 puffs twice a day.     - Action Plan: During respiratory illnesses/flares:  add Qvar 2 puffs twice a day in with Symbicort use.  Once illness is resolved can stop Qvar use.  Qvar will help provide more anti-inflammatory control to your airway    Control goals:  Full participation in all desired activities (may need albuterol before activity) Albuterol use two time or less a week on average (not counting use with activity) Cough interfering with sleep two time or less a month Oral steroids no more than once a year No hospitalizations  Shellfish allergy   -Continue avoidance of mollusks (clams, scallops, oysters)   -Have access to EpiPen as above in case of allergic reaction   -Follow emergency action plan in case of allergic reaction  Follow-up 6 months or sooner if needed

## 2023-03-20 NOTE — Progress Notes (Signed)
Follow-up Note  RE: [name] MRN: [id number] DOB: [birth year] Date of Office Visit: 03/20/2023   History of present illness: Kathryn Davis is a 72 y.o. female presenting today for follow-up of allergic rhinitis with conjunctivitis, allergic asthma, shellfish allergy.  She was last seen in the office on 05/07/22 by myself.    Discussed the use of AI scribe software for clinical note transcription with the patient, who gave verbal consent to proceed.  She presents with a persistent cough and throat clearing, which she attributes to the recent pollen season. She reports that the season seemed more severe than previous years, with symptoms persisting throughout the spring, summer, and into the fall. The patient has been using Atrovent nasal spray every morning, which she finds beneficial in managing the drainage. However, she still experiences a constant cough and throat clearing, suggesting that the current treatment may not be sufficient.  In addition to allergies, the patient has a history of asthma and is on Symbicort, taken twice in the morning and twice at night. She reports no need for a rescue inhaler, as it does not seem to make a difference in her symptoms. She believes her cough is more related to drainage leading to throat irritation rather than her asthma.  The patient also reports a history of shellfish allergy but has not had any accidental ingestions or need for an EpiPen.   She has been vaccinated against COVID-19, flu and has also received the RSV and shingles vaccines.  The patient has not had any major health changes, surgeries, or hospitalizations. She has not noticed any new medical issues. She has not had any colds or respiratory infections, which she typically gets around this time of year.  Review of systems: 10pt ROS negative unless noted above in HPI  All other systems negative unless noted above in HPI  Past medical/social/surgical/family  history have been reviewed and are unchanged unless specifically indicated below.  No changes  Medication List: Current Outpatient Medications  Medication Sig Dispense Refill   albuterol (VENTOLIN HFA) 108 (90 Base) MCG/ACT inhaler Inhale 2 puffs into the lungs every 4 (four) hours as needed for wheezing or shortness of breath. 54 g 1   atorvastatin (LIPITOR) 10 MG tablet Take 10 mg by mouth daily.     citalopram (CELEXA) 10 MG tablet Take 10 mg by mouth daily.     ELDERBERRY PO Take by mouth.     EPINEPHrine (EPIPEN 2-PAK) 0.3 mg/0.3 mL IJ SOAJ injection Use as directed for severe allergic reaction 1 each 1   estradiol (ESTRACE VAGINAL) 0.1 MG/GM vaginal cream Place 1 g vaginally 2 (two) times a week. 42.5 g 1   fluticasone (FLONASE) 50 MCG/ACT nasal spray Place into both nostrils daily.     fluticasone (FLOVENT HFA) 110 MCG/ACT inhaler Inhale 2 puffs into the lungs in the morning and at bedtime. 12 g 5   ipratropium (ATROVENT) 0.06 % nasal spray PLACE 2 SPRAYS INTO BOTH NOSTRILS 2 (TWO) TIMES DAILY 15 mL 5   Multiple Vitamin (MULTIVITAMIN PO) Take 1 tablet by mouth daily.     NON FORMULARY ALLERGY INJECTIONS     Spacer/Aero-Holding Rudean Curt Use as directed. 1 each 1   SYMBICORT 160-4.5 MCG/ACT inhaler 2 PUFFS TWICE DAILY WITH SPACER TO PREVENT COUGHING OR WHEEZING. 10.2 g 0   triamcinolone cream (KENALOG) 0.1 % APPLY 1 APPLICATION TOPICALLY DAILY AS NEEDED. 454 g 2   No current facility-administered medications for this visit.  Known medication allergies: Allergies  Allergen Reactions   Molds & Smuts     Other reaction(s): Cough   Shellfish Allergy Nausea And Vomiting     Physical examination: Blood pressure 132/70, pulse 63, temperature (!) 97.3 F (36.3 C), temperature source Temporal, resp. rate 12, weight 153 lb 9.6 oz (69.7 kg), SpO2 97%.  General: Alert, interactive, in no acute distress. HEENT: PERRLA, TMs pearly gray, turbinates minimally edematous without  discharge, post-pharynx non erythematous. Neck: Supple without lymphadenopathy. Lungs: Clear to auscultation without wheezing, rhonchi or rales. {no increased work of breathing. CV: Normal S1, S2 without murmurs. Abdomen: Nondistended, nontender. Skin: Warm and dry, without lesions or rashes. Extremities:  No clubbing, cyanosis or edema. Neuro:   Grossly intact.  Diagnositics/Labs:  Spirometry: FEV1: 1.98L 93%, FVC: 2.7L 98 nonobstructive pattern%, ratio consistent with nonobstructive pattern  Assessment and plan: Allergic rhinitis with conjunctivitis    -continue avoidance measures to grass, weed, tree, molds, dust mites and cat    -continue allergen immunotherapy per schedule.      -use Atrovent nasal spray 2 sprays each nostril daily.  Can be used up to 4 times a day if needed for nasal drainage control.       -continue daily Allegra 180mg  as needed.   Allergic asthma   -Allergen driven   -have access to albuterol inhaler 2 puffs every 4-6 hours as needed for cough/wheeze/shortness of breath/chest tightness.  May use 15-20 minutes prior to activity.   Monitor frequency of use.     - Symbicort 2 puffs twice a day.     - Action Plan: During respiratory illnesses/flares:  add Qvar 2 puffs twice a day in with Symbicort use.  Once illness is resolved can stop Qvar use.  Qvar will help provide more anti-inflammatory control to your airway    Control goals:  Full participation in all desired activities (may need albuterol before activity) Albuterol use two time or less a week on average (not counting use with activity) Cough interfering with sleep two time or less a month Oral steroids no more than once a year No hospitalizations  Shellfish allergy   -Continue avoidance of mollusks (clams, scallops, oysters)   -Have access to EpiPen as above in case of allergic reaction   -Follow emergency action plan in case of allergic reaction  Follow-up 6 months or sooner if needed  I  appreciate the opportunity to take part in Elisea's care. Please do not hesitate to contact me with questions.  Sincerely,   Margo Aye, MD Allergy/Immunology [facility]

## 2023-03-27 ENCOUNTER — Ambulatory Visit (INDEPENDENT_AMBULATORY_CARE_PROVIDER_SITE_OTHER): Payer: Self-pay | Admitting: *Deleted

## 2023-03-27 DIAGNOSIS — J309 Allergic rhinitis, unspecified: Secondary | ICD-10-CM

## 2023-04-21 ENCOUNTER — Ambulatory Visit (INDEPENDENT_AMBULATORY_CARE_PROVIDER_SITE_OTHER): Payer: Medicare PPO | Admitting: *Deleted

## 2023-04-21 DIAGNOSIS — J309 Allergic rhinitis, unspecified: Secondary | ICD-10-CM | POA: Diagnosis not present

## 2023-04-30 ENCOUNTER — Ambulatory Visit (INDEPENDENT_AMBULATORY_CARE_PROVIDER_SITE_OTHER): Payer: Medicare PPO

## 2023-04-30 DIAGNOSIS — J309 Allergic rhinitis, unspecified: Secondary | ICD-10-CM

## 2023-05-03 ENCOUNTER — Other Ambulatory Visit: Payer: Self-pay | Admitting: Radiology

## 2023-05-03 DIAGNOSIS — N760 Acute vaginitis: Secondary | ICD-10-CM

## 2023-05-05 NOTE — Telephone Encounter (Signed)
Medication refill request: estrace vaginal cream Last AEX:  11-22-20 Next AEX: not scheduled Last MMG (if hormonal medication request): 10-31-22 bilateral & left breast u/s birads 3:probably benign Refill authorized: Last refilled 02-26-23 42.5 grams with 1 refill. Please approve or deny as appropriate

## 2023-05-07 ENCOUNTER — Ambulatory Visit (INDEPENDENT_AMBULATORY_CARE_PROVIDER_SITE_OTHER): Payer: Medicare PPO | Admitting: *Deleted

## 2023-05-07 DIAGNOSIS — J309 Allergic rhinitis, unspecified: Secondary | ICD-10-CM | POA: Diagnosis not present

## 2023-05-08 DIAGNOSIS — R7303 Prediabetes: Secondary | ICD-10-CM | POA: Diagnosis not present

## 2023-06-05 ENCOUNTER — Ambulatory Visit (INDEPENDENT_AMBULATORY_CARE_PROVIDER_SITE_OTHER): Payer: Medicare PPO

## 2023-06-05 DIAGNOSIS — J309 Allergic rhinitis, unspecified: Secondary | ICD-10-CM | POA: Diagnosis not present

## 2023-06-12 ENCOUNTER — Encounter: Payer: Self-pay | Admitting: Radiology

## 2023-06-12 ENCOUNTER — Ambulatory Visit: Payer: Medicare PPO | Admitting: Radiology

## 2023-06-12 VITALS — BP 136/72 | HR 73

## 2023-06-12 DIAGNOSIS — Z4689 Encounter for fitting and adjustment of other specified devices: Secondary | ICD-10-CM

## 2023-06-12 NOTE — Progress Notes (Signed)
   Subjective:     History of Present Illness: Kathryn Davis is a 73 y.o. female with stage III pelvic organ prolapse who presents for a pessary check. She is using a size 4 ring with support pessary. The pessary has been working well and she has no complaints. She is using vaginal estrogen. She denies vaginal bleeding.  Past Medical History: Patient  has a past medical history of Asthma, Cystocele, Osteoarthritis, Urinary incontinence, Uterine prolapse, and Vaginal pessary present.   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   Medications: She has a current medication list which includes the following prescription(s): albuterol, atorvastatin, citalopram, elderberry, epinephrine, estradiol, fluticasone, fluticasone, ipratropium, multiple vitamin, NON FORMULARY, spacer/aero-holding chambers, symbicort, and triamcinolone cream.   Allergies: Patient is allergic to molds & smuts and shellfish allergy.       Objective:    Physical Exam: BP 136/72 (BP Location: Left Arm, Patient Position: Sitting, Cuff Size: Normal)   Pulse 73   SpO2 97%  Gen: No apparent distress, A&O x 3. Detailed Urogynecologic Evaluation:  Pelvic Exam: Normal external female genitalia; Bartholin's and Skene's glands normal in appearance; urethral meatus normal in appearance, no urethral masses or discharge. The pessary was noted to be in place. It was removed and cleaned. Speculum exam revealed no erythema in the vagina. The pessary was replaced. It was comfortable to the patient and fit well.     Assessment/Plan:    1. Pessary maintenance  2. Vaginal inflammation from pessary, initial encounter (HCC) Continue estrace - estradiol (ESTRACE VAGINAL) 0.1 MG/GM vaginal cream; Place 1 g vaginally 2 (two) times a week.  Dispense: 42.5 g; Refill: 1    She will keep the pessary in place  until next visit. She will continue to use estrogen. She will follow-up in 4 months for a pessary check or sooner as needed.  All questions were answered.  Daniqua Campoy B, NP 10:16 AM

## 2023-07-03 ENCOUNTER — Ambulatory Visit (INDEPENDENT_AMBULATORY_CARE_PROVIDER_SITE_OTHER): Payer: Self-pay

## 2023-07-03 DIAGNOSIS — J309 Allergic rhinitis, unspecified: Secondary | ICD-10-CM | POA: Diagnosis not present

## 2023-08-04 ENCOUNTER — Ambulatory Visit (INDEPENDENT_AMBULATORY_CARE_PROVIDER_SITE_OTHER)

## 2023-08-04 DIAGNOSIS — J309 Allergic rhinitis, unspecified: Secondary | ICD-10-CM | POA: Diagnosis not present

## 2023-09-02 ENCOUNTER — Ambulatory Visit (INDEPENDENT_AMBULATORY_CARE_PROVIDER_SITE_OTHER)

## 2023-09-02 DIAGNOSIS — J309 Allergic rhinitis, unspecified: Secondary | ICD-10-CM | POA: Diagnosis not present

## 2023-09-19 ENCOUNTER — Other Ambulatory Visit: Payer: Self-pay | Admitting: Allergy

## 2023-09-22 ENCOUNTER — Telehealth: Payer: Self-pay | Admitting: Allergy

## 2023-09-22 MED ORDER — SYMBICORT 160-4.5 MCG/ACT IN AERO: INHALATION_SPRAY | RESPIRATORY_TRACT | 2 refills | Status: DC

## 2023-09-22 NOTE — Telephone Encounter (Signed)
 Duplicate medication refill request - refer to earlier telephone encounter.

## 2023-09-22 NOTE — Telephone Encounter (Signed)
 Per Provider:  Yes that is fine.  Please refill her medications.  Thank you.    Medication refills on Symbicort  sent to [facility]

## 2023-09-22 NOTE — Telephone Encounter (Signed)
 Kathryn Davis would like to have her meds refilled so that she can schedule an OV with you in August.

## 2023-09-22 NOTE — Telephone Encounter (Signed)
 Patient stated she needed her Symbicort  refilled and sent over to the [facility] on [address].  Best Contact: [phone]

## 2023-10-05 ENCOUNTER — Ambulatory Visit (INDEPENDENT_AMBULATORY_CARE_PROVIDER_SITE_OTHER): Payer: Self-pay

## 2023-10-05 DIAGNOSIS — J309 Allergic rhinitis, unspecified: Secondary | ICD-10-CM | POA: Diagnosis not present

## 2023-10-08 ENCOUNTER — Encounter: Payer: Self-pay | Admitting: Radiology

## 2023-10-08 ENCOUNTER — Ambulatory Visit: Admitting: Radiology

## 2023-10-08 VITALS — BP 116/72 | HR 69 | Wt 143.0 lb

## 2023-10-08 DIAGNOSIS — Z4689 Encounter for fitting and adjustment of other specified devices: Secondary | ICD-10-CM

## 2023-10-08 NOTE — Progress Notes (Signed)
   Subjective:     History of Present Illness: Kathryn Davis is a 73 y.o. female with stage III pelvic organ prolapse who presents for a pessary check. She is using a size 4 ring with support pessary. The pessary has been working well and she has no complaints. She is using vaginal estrogen. She denies vaginal bleeding.  Past Medical History: Patient  has a past medical history of Asthma, Cystocele, Osteoarthritis, Urinary incontinence, Uterine prolapse, and Vaginal pessary present.   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   Medications: She has a current medication list which includes the following prescription(s): albuterol , atorvastatin, citalopram , comirnaty, elderberry, epinephrine , estradiol , fluticasone , fluticasone , ipratropium, multiple vitamin, NON FORMULARY, spacer/aero-holding chambers, symbicort , and triamcinolone  cream.   Allergies: Patient is allergic to molds & smuts and shellfish allergy.       Objective:    Physical Exam: BP 116/72 (BP Location: Left Arm, Patient Position: Sitting, Cuff Size: Normal)   Pulse 69   Wt 143 lb (64.9 kg)   SpO2 97%   BMI 24.93 kg/m  Gen: No apparent distress, A&O x 3. Detailed Urogynecologic Evaluation:  Pelvic Exam: Normal external female genitalia; Bartholin's and Skene's glands normal in appearance; urethral meatus normal in appearance, no urethral masses or discharge. The pessary was noted to be in place. It was removed and cleaned. Speculum exam revealed no erythema in the vagina. The pessary was replaced. It was comfortable to the patient and fit well.     Assessment/Plan:    1. Pessary maintenance  2. Vaginal inflammation from pessary, initial encounter (HCC) Continue estrace  - estradiol  (ESTRACE  VAGINAL) 0.1 MG/GM vaginal cream; Place 1 g vaginally 2 (two) times a week.  Dispense: 42.5 g;  Refill: 1    She will keep the pessary in place until next visit. She will continue to use estrogen. She will follow-up in 4 months for a pessary check or sooner as needed.  All questions were answered.  Malakye Nolden B, NP 9:31 AM

## 2023-10-21 DIAGNOSIS — E559 Vitamin D deficiency, unspecified: Secondary | ICD-10-CM | POA: Diagnosis not present

## 2023-10-21 DIAGNOSIS — Z789 Other specified health status: Secondary | ICD-10-CM | POA: Diagnosis not present

## 2023-10-21 DIAGNOSIS — R634 Abnormal weight loss: Secondary | ICD-10-CM | POA: Diagnosis not present

## 2023-10-21 DIAGNOSIS — R7303 Prediabetes: Secondary | ICD-10-CM | POA: Diagnosis not present

## 2023-10-21 DIAGNOSIS — R222 Localized swelling, mass and lump, trunk: Secondary | ICD-10-CM | POA: Diagnosis not present

## 2023-10-21 DIAGNOSIS — F419 Anxiety disorder, unspecified: Secondary | ICD-10-CM | POA: Diagnosis not present

## 2023-10-27 DIAGNOSIS — R222 Localized swelling, mass and lump, trunk: Secondary | ICD-10-CM | POA: Diagnosis not present

## 2023-10-29 DIAGNOSIS — Z1331 Encounter for screening for depression: Secondary | ICD-10-CM | POA: Diagnosis not present

## 2023-10-29 DIAGNOSIS — Z0000 Encounter for general adult medical examination without abnormal findings: Secondary | ICD-10-CM | POA: Diagnosis not present

## 2023-11-06 ENCOUNTER — Ambulatory Visit (INDEPENDENT_AMBULATORY_CARE_PROVIDER_SITE_OTHER)

## 2023-11-06 DIAGNOSIS — J309 Allergic rhinitis, unspecified: Secondary | ICD-10-CM

## 2023-11-12 DIAGNOSIS — J3089 Other allergic rhinitis: Secondary | ICD-10-CM | POA: Diagnosis not present

## 2023-11-12 NOTE — Progress Notes (Signed)
 VIALS MADE ON 11/12/23

## 2023-11-13 DIAGNOSIS — R634 Abnormal weight loss: Secondary | ICD-10-CM | POA: Diagnosis not present

## 2023-11-13 DIAGNOSIS — Z8719 Personal history of other diseases of the digestive system: Secondary | ICD-10-CM | POA: Diagnosis not present

## 2023-11-13 DIAGNOSIS — J3081 Allergic rhinitis due to animal (cat) (dog) hair and dander: Secondary | ICD-10-CM | POA: Diagnosis not present

## 2023-11-13 DIAGNOSIS — R197 Diarrhea, unspecified: Secondary | ICD-10-CM | POA: Diagnosis not present

## 2023-11-13 DIAGNOSIS — R194 Change in bowel habit: Secondary | ICD-10-CM | POA: Diagnosis not present

## 2023-11-19 DIAGNOSIS — R197 Diarrhea, unspecified: Secondary | ICD-10-CM | POA: Diagnosis not present

## 2023-11-19 DIAGNOSIS — R194 Change in bowel habit: Secondary | ICD-10-CM | POA: Diagnosis not present

## 2023-12-01 DIAGNOSIS — E559 Vitamin D deficiency, unspecified: Secondary | ICD-10-CM | POA: Diagnosis not present

## 2023-12-01 DIAGNOSIS — Z713 Dietary counseling and surveillance: Secondary | ICD-10-CM | POA: Diagnosis not present

## 2023-12-01 DIAGNOSIS — R7303 Prediabetes: Secondary | ICD-10-CM | POA: Diagnosis not present

## 2023-12-03 ENCOUNTER — Ambulatory Visit: Admitting: Allergy

## 2023-12-03 ENCOUNTER — Encounter: Payer: Self-pay | Admitting: Allergy

## 2023-12-03 ENCOUNTER — Other Ambulatory Visit: Payer: Self-pay

## 2023-12-03 ENCOUNTER — Other Ambulatory Visit: Payer: Self-pay | Admitting: Allergy

## 2023-12-03 VITALS — BP 124/66 | HR 78 | Temp 98.4°F | Ht 63.5 in | Wt 139.3 lb

## 2023-12-03 DIAGNOSIS — J4520 Mild intermittent asthma, uncomplicated: Secondary | ICD-10-CM | POA: Diagnosis not present

## 2023-12-03 DIAGNOSIS — H1013 Acute atopic conjunctivitis, bilateral: Secondary | ICD-10-CM

## 2023-12-03 DIAGNOSIS — T7800XD Anaphylactic reaction due to unspecified food, subsequent encounter: Secondary | ICD-10-CM | POA: Diagnosis not present

## 2023-12-03 DIAGNOSIS — J302 Other seasonal allergic rhinitis: Secondary | ICD-10-CM

## 2023-12-03 DIAGNOSIS — J3089 Other allergic rhinitis: Secondary | ICD-10-CM

## 2023-12-03 MED ORDER — SYMBICORT 160-4.5 MCG/ACT IN AERO: INHALATION_SPRAY | RESPIRATORY_TRACT | 5 refills | Status: AC

## 2023-12-03 MED ORDER — IPRATROPIUM BROMIDE 0.06 % NA SOLN: 2.0000 | Freq: Two times a day (BID) | NASAL | 5 refills | Status: AC

## 2023-12-03 MED ORDER — FEXOFENADINE HCL 180 MG PO TABS: 180.0000 mg | ORAL_TABLET | Freq: Every day | ORAL | 5 refills | Status: AC

## 2023-12-03 MED ORDER — QVAR REDIHALER 80 MCG/ACT IN AERB: INHALATION_SPRAY | RESPIRATORY_TRACT | 0 refills | Status: AC

## 2023-12-03 MED ORDER — ALBUTEROL SULFATE HFA 108 (90 BASE) MCG/ACT IN AERS: 2.0000 | INHALATION_SPRAY | RESPIRATORY_TRACT | 1 refills | Status: AC | PRN

## 2023-12-03 NOTE — Patient Instructions (Addendum)
 Allergic rhinitis with conjunctivitis    -continue avoidance measures to grass, weed, tree, molds, dust mites and cat    -continue allergen immunotherapy per schedule.  Monthly dosing a this time.  Have your epinephrine  device on shot days.  Will see if your insurance covers Neffy , nasal epinephrine  device.      -use Atrovent  nasal spray 2 sprays each nostril daily.  Can be used up to 4 times a day if needed for nasal drainage control.       -continue daily Allegra  180mg  as needed.   Allergic asthma   -Allergen driven   -have access to albuterol  inhaler 2 puffs every 4-6 hours as needed for cough/wheeze/shortness of breath/chest tightness.  May use 15-20 minutes prior to activity.   Monitor frequency of use.     - Symbicort  160mcg 2 puffs twice a day.     - Action Plan: During respiratory illnesses/flares:  add Qvar  2 puffs twice a day in with Symbicort  use.  Once illness is resolved can stop Qvar  use.  Qvar  will help provide more anti-inflammatory control to your airway    Control goals:  Full participation in all desired activities (may need albuterol  before activity) Albuterol  use two time or less a week on average (not counting use with activity) Cough interfering with sleep two time or less a month Oral steroids no more than once a year No hospitalizations  Shellfish allergy   -Continue avoidance of mollusks (clams, scallops, oysters) and lactose   -Have access to EpiPen  (or Neffy  device) as above in case of allergic reaction   -Follow emergency action plan in case of allergic reaction  Follow-up 6 months or sooner if needed

## 2023-12-03 NOTE — Progress Notes (Signed)
 Follow-up Note  RE: QUINTINA HAKEEM MRN: [id number] DOB: 18-Oct-1950 Date of Office Visit: 12/03/2023   History of present illness: Kathryn Davis is a 73 y.o. female presenting today for follow-up of allergic rhinitis with conjunctivitis, asthma and food allergy.  She was last seen in the office on 03/20/2023 by myself. Discussed the use of AI scribe software for clinical note transcription with the patient, who gave verbal consent to proceed.  Her breathing has been stable since December, with no need for her rescue inhaler. She experiences breathing difficulties when ill, but she has remained healthy this year without any respiratory illnesses.  She continues to use Symbicort  twice daily and rinses her mouth afterward. She has not had the need to add in Qvar  as she has not been sick this year, but keeps it as an option in case of respiratory illness. She is concerned about potential exposure to illnesses from her grandchildren.  She has a history of chronic coughing, which was linked to lactose consumption after an elimination diet was performed. She has eliminated dairy products, opting for alternatives like oat milk and cashew yogurt, which has resolved her coughing. Her niece experienced similar symptoms and also benefited from eliminating lactose.  She avoids mollusks due to allergies but tolerates shrimp, crab, lobster, salmon, and tuna without issues.  She has access to an epinephrine  device in case of a reaction.  She has not required use since the last visit.  She manages her allergies with monthly allergy shots which she is tolerating at this time and has not needed Allegra  recently. She uses Atrovent  nasal spray in the morning, which she finds effective. This year has been unusual for pollen, with tree, grass, and weed pollen present since spring, along with increased outdoor mold due to rain. Despite this, her symptoms have been well-managed.     Review of  systems: 10pt ROS negative unless noted above in HPI  Past medical/social/surgical/family history have been reviewed and are unchanged unless specifically indicated below.  No changes  Medication List: Current Outpatient Medications  Medication Sig Dispense Refill   albuterol  (VENTOLIN  HFA) 108 (90 Base) MCG/ACT inhaler Inhale 2 puffs into the lungs every 4 (four) hours as needed for wheezing or shortness of breath. 36 g 1   atorvastatin (LIPITOR) 10 MG tablet Take 10 mg by mouth daily.     citalopram  (CELEXA ) 10 MG tablet Take 10 mg by mouth daily.     COMIRNATY syringe      ELDERBERRY PO Take by mouth.     EPINEPHrine  (EPIPEN  2-PAK) 0.3 mg/0.3 mL IJ SOAJ injection Use as directed for severe allergic reaction 1 each 1   estradiol  (ESTRACE ) 0.1 MG/GM vaginal cream PLACE 1 GRAM VAGINALLY 2 TIMES PER WEEK 42.5 g 1   fluticasone  (FLONASE) 50 MCG/ACT nasal spray Place into both nostrils daily.     fluticasone  (FLOVENT HFA) 110 MCG/ACT inhaler Inhale 2 puffs into the lungs in the morning and at bedtime. 12 g 5   ipratropium (ATROVENT ) 0.06 % nasal spray Place 2 sprays into both nostrils 2 (two) times daily. 15 mL 5   Multiple Vitamin (MULTIVITAMIN PO) Take 1 tablet by mouth daily.     NON FORMULARY ALLERGY INJECTIONS     Spacer/Aero-Holding Raguel FRENCH Use as directed. 1 each 1   SYMBICORT  160-4.5 MCG/ACT inhaler 2 PUFFS TWICE DAILY WITH SPACER TO PREVENT COUGHING OR WHEEZING. 10.2 each 2   triamcinolone  cream (KENALOG ) 0.1 % APPLY 1 APPLICATION  TOPICALLY DAILY AS NEEDED. 454 g 2   No current facility-administered medications for this visit.     Known medication allergies: Allergies  Allergen Reactions   Molds & Smuts     Other reaction(s): Cough   Shellfish Allergy Nausea And Vomiting     Physical examination: Blood pressure 124/66, pulse 78, temperature 98.4 F (36.9 C), temperature source Temporal, height 5' 3.5 (1.613 m), weight 139 lb 4.8 oz (63.2 kg), SpO2 97%.  General:  Alert, interactive, in no acute distress. HEENT: PERRLA, TMs pearly gray, turbinates non-edematous without discharge, post-pharynx non erythematous. Neck: Supple without lymphadenopathy. Lungs: Clear to auscultation without wheezing, rhonchi or rales. {no increased work of breathing. CV: Normal S1, S2 without murmurs. Abdomen: Nondistended, nontender. Skin: Warm and dry, without lesions or rashes. Extremities:  No clubbing, cyanosis or edema. Neuro:   Grossly intact.  Diagnostics/Labs:  Spirometry: FEV1: 1.89L 90%, FVC: 2.71L 100%, ratio consistent with nonobstructive pattern  Assessment and plan:   Allergic rhinitis with conjunctivitis    -continue avoidance measures to grass, weed, tree, molds, dust mites and cat    -continue allergen immunotherapy per schedule.  Monthly dosing a this time.  Have your epinephrine  device on shot days.  Will see if your insurance covers Neffy , nasal epinephrine  device.      -use Atrovent  nasal spray 2 sprays each nostril daily.  Can be used up to 4 times a day if needed for nasal drainage control.       -continue daily Allegra  180mg  as needed.   Allergic asthma   -Allergen driven   -have access to albuterol  inhaler 2 puffs every 4-6 hours as needed for cough/wheeze/shortness of breath/chest tightness.  May use 15-20 minutes prior to activity.   Monitor frequency of use.     - Symbicort  160mcg 2 puffs twice a day.     - Action Plan: During respiratory illnesses/flares:  add Qvar  2 puffs twice a day in with Symbicort  use.  Once illness is resolved can stop Qvar  use.  Qvar  will help provide more anti-inflammatory control to your airway    Control goals:  Full participation in all desired activities (may need albuterol  before activity) Albuterol  use two time or less a week on average (not counting use with activity) Cough interfering with sleep two time or less a month Oral steroids no more than once a year No hospitalizations  Shellfish allergy    -Continue avoidance of mollusks (clams, scallops, oysters) and lactose   -Have access to EpiPen  (or Neffy  device) as above in case of allergic reaction   -Follow emergency action plan in case of allergic reaction  Follow-up 6 months or sooner if needed  I appreciate the opportunity to take part in Patti's care. Please do not hesitate to contact me with questions.  Sincerely,   Danita Brain, MD Allergy/Immunology [facility]

## 2023-12-10 ENCOUNTER — Telehealth: Payer: Self-pay

## 2023-12-10 ENCOUNTER — Other Ambulatory Visit (HOSPITAL_COMMUNITY): Payer: Self-pay

## 2023-12-10 NOTE — Telephone Encounter (Signed)
 Pharmacy Patient Advocate Encounter   Received notification from RX Request Messages that prior authorization for Qvar  RediHaler 80MCG/ACT aerosol is required/requested.   Insurance verification completed.   The patient is insured through [employer] .   Per test claim: PA required; PA submitted to above mentioned insurance via Latent Key/confirmation #/EOC [id number] Status is pending

## 2023-12-10 NOTE — Telephone Encounter (Signed)
 PA request has been Submitted. New Encounter has been or will be created for follow up. For additional info see Pharmacy Prior Auth telephone encounter from 12-10-2023.

## 2023-12-11 NOTE — Telephone Encounter (Signed)
 Pharmacy Patient Advocate Encounter  Received notification from [employer] that Prior Authorization for Qvar  RediHaler 80MCG/ACT aerosol has been APPROVED from 01-01-225 to 04-13-2024   PA #/Case ID/Reference #: [id number]

## 2023-12-11 NOTE — Telephone Encounter (Signed)
 LVM stating that Qvar  PA has been approved.

## 2023-12-17 ENCOUNTER — Other Ambulatory Visit: Payer: Self-pay | Admitting: Family Medicine

## 2023-12-17 DIAGNOSIS — R928 Other abnormal and inconclusive findings on diagnostic imaging of breast: Secondary | ICD-10-CM

## 2023-12-23 ENCOUNTER — Ambulatory Visit: Admitting: Physician Assistant

## 2023-12-23 ENCOUNTER — Encounter

## 2023-12-23 ENCOUNTER — Encounter: Payer: Self-pay | Admitting: Physician Assistant

## 2023-12-23 VITALS — BP 121/70

## 2023-12-23 DIAGNOSIS — L578 Other skin changes due to chronic exposure to nonionizing radiation: Secondary | ICD-10-CM | POA: Diagnosis not present

## 2023-12-23 DIAGNOSIS — L309 Dermatitis, unspecified: Secondary | ICD-10-CM

## 2023-12-23 DIAGNOSIS — L814 Other melanin hyperpigmentation: Secondary | ICD-10-CM | POA: Diagnosis not present

## 2023-12-23 DIAGNOSIS — L821 Other seborrheic keratosis: Secondary | ICD-10-CM | POA: Diagnosis not present

## 2023-12-23 DIAGNOSIS — Z1283 Encounter for screening for malignant neoplasm of skin: Secondary | ICD-10-CM

## 2023-12-23 DIAGNOSIS — L308 Other specified dermatitis: Secondary | ICD-10-CM

## 2023-12-23 DIAGNOSIS — D229 Melanocytic nevi, unspecified: Secondary | ICD-10-CM

## 2023-12-23 DIAGNOSIS — W908XXA Exposure to other nonionizing radiation, initial encounter: Secondary | ICD-10-CM | POA: Diagnosis not present

## 2023-12-23 DIAGNOSIS — D1801 Hemangioma of skin and subcutaneous tissue: Secondary | ICD-10-CM | POA: Diagnosis not present

## 2023-12-23 MED ORDER — TRIAMCINOLONE ACETONIDE 0.1 % EX CREA: TOPICAL_CREAM | Freq: Every day | CUTANEOUS | 3 refills | Status: AC | PRN

## 2023-12-23 NOTE — Progress Notes (Deleted)
   New Patient Visit   Subjective  Kathryn Davis is a 73 y.o. female who presents for the following: ***  Patient states she has *** located at the {LOCATION ON ANIB:78048} that she would like to have examined. Patient reports the areas have been there for {NUMBER 1-10:22536} {Time; units week/month/year w plurals:19499}. She reports the areas {Actions; are/are not:16769} bothersome.Patient rates irritation {NUMBER 1-10:22536} out of 10. She states that the areas {ACTIONS; HAVE/HAVE NOT:19434} spread. Patient reports she {HAS HAS WNU:81165} previously been treated for these areas. Patient *** Hx of bx. Patient *** family history of skin cancer(s).  The patient has spots, moles and lesions to be evaluated, some may be new or changing and the patient may have concern these could be cancer.   The following portions of the chart were reviewed this encounter and updated as appropriate: medications, allergies, medical history  Review of Systems:  No other skin or systemic complaints except as noted in HPI or Assessment and Plan.  Objective  Well appearing patient in no apparent distress; mood and affect are within normal limits.  ***A full examination was performed including scalp, head, eyes, ears, nose, lips, neck, chest, axillae, abdomen, back, buttocks, bilateral upper extremities, bilateral lower extremities, hands, feet, fingers, toes, fingernails, and toenails. All findings within normal limits unless otherwise noted below.  ***A focused examination was performed of the following areas: ***  Relevant exam findings are noted in the Assessment and Plan.    Assessment & Plan       No follow-ups on file.  I, Doyce Pan, CMA, am acting as scribe for SANDRIDGE,BRENDA K, PA-C.   Documentation: I have reviewed the above documentation for accuracy and completeness, and I agree with the above.  SANDRIDGE,BRENDA K, PA-C

## 2023-12-23 NOTE — Patient Instructions (Signed)

## 2023-12-23 NOTE — Progress Notes (Signed)
   New Patient Visit   Subjective  Kathryn Davis is a 73 y.o. female NEW PATIENT who presents for the following: Total Body Skin Exam (TBSE). No history of skin cancer. Does get routine skin exams - previously saw [facility], PA-C. Last skin exam was a few years ago.   Other concern: wishes to have RX for hand dermatitis that flares in the winter time.   PThe following portions of the chart were reviewed this encounter and updated as appropriate: medications, allergies, medical history  Review of Systems:  No other skin or systemic complaints except as noted in HPI or Assessment and Plan.  Objective  Well appearing patient in no apparent distress; mood and affect are within normal limits.  A full examination was performed including scalp, head, eyes, ears, nose, lips, neck, chest, axillae, abdomen, back, buttocks, bilateral upper extremities, bilateral lower extremities, hands, feet, fingers, toes, fingernails, and toenails. All findings within normal limits unless otherwise noted below.     Relevant exam findings are noted in the Assessment and Plan.    Assessment & Plan    HAND ECZEMA  Exam: Clear today.   Treatment Plan: - Rx for triancinolone 0.1% cream to use as needed.     LENTIGINES, SEBORRHEIC KERATOSES, HEMANGIOMAS - Benign normal skin lesions - Benign-appearing - Call for any changes  MELANOCYTIC NEVI - Tan-brown and/or pink-flesh-colored symmetric macules and papules - Benign appearing on exam today - Observation - Call clinic for new or changing moles - Recommend daily use of broad spectrum spf 30+ sunscreen to sun-exposed areas.   ACTINIC DAMAGE - Chronic condition, secondary to cumulative UV/sun exposure - diffuse scaly erythematous macules with underlying dyspigmentation - Recommend daily broad spectrum sunscreen SPF 30+ to sun-exposed areas, reapply every 2 hours as needed.  - Staying in the shade or wearing long sleeves, sun glasses  (UVA+UVB protection) and wide brim hats (4-inch brim around the entire circumference of the hat) are also recommended for sun protection.  - Call for new or changing lesions.  SKIN CANCER SCREENING PERFORMED TODAY ECZEMA, UNSPECIFIED TYPE   Related Medications triamcinolone  cream (KENALOG ) 0.1 % Apply topically daily as needed. LENTIGINES   SEBORRHEIC KERATOSIS   CHERRY ANGIOMA   MULTIPLE BENIGN NEVI   ACTINIC SKIN DAMAGE   SCREENING EXAM FOR SKIN CANCER    Return in about 1 year (around 12/22/2024) for TBSE follow up. I, Doyce Pan, CMA, am acting as scribe for Jiah Bari K, PA-C.   Documentation: I have reviewed the above documentation for accuracy and completeness, and I agree with the above.  Buffie Herne K, PA-C

## 2023-12-24 ENCOUNTER — Ambulatory Visit
Admission: RE | Admit: 2023-12-24 | Discharge: 2023-12-24 | Disposition: A | Discharge status: Home / Self Care | Source: Ambulatory Visit | Attending: Family Medicine | Admitting: Family Medicine

## 2023-12-24 DIAGNOSIS — R928 Other abnormal and inconclusive findings on diagnostic imaging of breast: Secondary | ICD-10-CM

## 2023-12-24 DIAGNOSIS — N6489 Other specified disorders of breast: Secondary | ICD-10-CM | POA: Diagnosis not present

## 2023-12-31 DIAGNOSIS — R194 Change in bowel habit: Secondary | ICD-10-CM | POA: Diagnosis not present

## 2023-12-31 DIAGNOSIS — R197 Diarrhea, unspecified: Secondary | ICD-10-CM | POA: Diagnosis not present

## 2023-12-31 DIAGNOSIS — K6389 Other specified diseases of intestine: Secondary | ICD-10-CM | POA: Diagnosis not present

## 2023-12-31 DIAGNOSIS — R634 Abnormal weight loss: Secondary | ICD-10-CM | POA: Diagnosis not present

## 2023-12-31 DIAGNOSIS — K5730 Diverticulosis of large intestine without perforation or abscess without bleeding: Secondary | ICD-10-CM | POA: Diagnosis not present

## 2024-01-13 ENCOUNTER — Ambulatory Visit

## 2024-01-13 DIAGNOSIS — J309 Allergic rhinitis, unspecified: Secondary | ICD-10-CM | POA: Diagnosis not present

## 2024-01-19 DIAGNOSIS — R194 Change in bowel habit: Secondary | ICD-10-CM | POA: Diagnosis not present

## 2024-01-19 DIAGNOSIS — Z8719 Personal history of other diseases of the digestive system: Secondary | ICD-10-CM | POA: Diagnosis not present

## 2024-01-19 DIAGNOSIS — R197 Diarrhea, unspecified: Secondary | ICD-10-CM | POA: Diagnosis not present

## 2024-01-19 DIAGNOSIS — R634 Abnormal weight loss: Secondary | ICD-10-CM | POA: Diagnosis not present

## 2024-01-20 DIAGNOSIS — R7303 Prediabetes: Secondary | ICD-10-CM | POA: Diagnosis not present

## 2024-01-20 DIAGNOSIS — R197 Diarrhea, unspecified: Secondary | ICD-10-CM | POA: Diagnosis not present

## 2024-01-20 DIAGNOSIS — Z6824 Body mass index (BMI) 24.0-24.9, adult: Secondary | ICD-10-CM | POA: Diagnosis not present

## 2024-01-20 DIAGNOSIS — F419 Anxiety disorder, unspecified: Secondary | ICD-10-CM | POA: Diagnosis not present

## 2024-01-20 DIAGNOSIS — E559 Vitamin D deficiency, unspecified: Secondary | ICD-10-CM | POA: Diagnosis not present

## 2024-01-20 NOTE — Progress Notes (Signed)
 VIALS MADE 01-20-24

## 2024-01-21 ENCOUNTER — Other Ambulatory Visit: Payer: Self-pay | Admitting: Radiology

## 2024-01-21 DIAGNOSIS — N760 Acute vaginitis: Secondary | ICD-10-CM

## 2024-01-21 NOTE — Telephone Encounter (Signed)
 Med refill request:ESTRACE   Last AEX: 11/22/20 last OV 10/08/23 Next AEX: not scheduled message sent to FD  Last MMG (if hormonal med) 12/24/23 Birads cat 2 benign  Refill authorized: Last rx 05/05/23 #42.5g with 1 refill. Please approve or deny

## 2024-02-05 ENCOUNTER — Ambulatory Visit (INDEPENDENT_AMBULATORY_CARE_PROVIDER_SITE_OTHER)

## 2024-02-05 DIAGNOSIS — J309 Allergic rhinitis, unspecified: Secondary | ICD-10-CM

## 2024-02-09 ENCOUNTER — Ambulatory Visit: Admitting: Radiology

## 2024-02-09 ENCOUNTER — Encounter: Payer: Self-pay | Admitting: Radiology

## 2024-02-09 VITALS — BP 120/74 | HR 82 | Wt 142.0 lb

## 2024-02-09 DIAGNOSIS — T8389XD Other specified complication of genitourinary prosthetic devices, implants and grafts, subsequent encounter: Secondary | ICD-10-CM | POA: Diagnosis not present

## 2024-02-09 DIAGNOSIS — N898 Other specified noninflammatory disorders of vagina: Secondary | ICD-10-CM | POA: Diagnosis not present

## 2024-02-09 DIAGNOSIS — Z4689 Encounter for fitting and adjustment of other specified devices: Secondary | ICD-10-CM | POA: Diagnosis not present

## 2024-02-09 NOTE — Progress Notes (Signed)
   Subjective:     History of Present Illness: Kathryn Davis is a 73 y.o. female with stage III pelvic organ prolapse who presents for a pessary check. She is using a size 4 ring with support pessary. The pessary has been working well and she has no complaints. She is using vaginal estrogen but only once a week. She has had some light vaginal bleeding after she uses the estrogen applicator.   Past Medical History: Patient  has a past medical history of Asthma, Cystocele, Osteoarthritis, Urinary incontinence, Uterine prolapse, and Vaginal pessary present.   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   Medications: She has a current medication list which includes the following prescription(s): albuterol , atorvastatin, qvar  redihaler, bisacodyl, citalopram , comirnaty, elderberry, epinephrine , estradiol , fexofenadine , fluticasone , ipratropium, multiple vitamin, NON FORMULARY, spacer/aero-holding chambers, symbicort , triamcinolone  cream, and vitamin d (cholecalciferol).   Allergies: Patient is allergic to molds & smuts and shellfish allergy.       Objective:    Physical Exam: BP 120/74 (BP Location: Left Arm, Patient Position: Sitting, Cuff Size: Normal)   Pulse 82   Wt 142 lb (64.4 kg)   SpO2 98%   BMI 24.76 kg/m  Gen: No apparent distress, A&O x 3. Detailed Urogynecologic Evaluation:  Pelvic Exam: Normal external female genitalia; Bartholin's and Skene's glands normal in appearance; urethral meatus normal in appearance, no urethral masses or discharge. The pessary was noted to be in place. It was removed and cleaned. Speculum exam revealed no erythema in the vagina. The pessary was replaced. It was comfortable to the patient and fit well.     Assessment/Plan:    1. Pessary maintenance (Primary)  2. Vaginal erosion secondary to pessary use, subsequent  encounter   She will keep the pessary in place until next visit. She will continue to use estrogen. Increase use to twice weekly and bring with her at her next visit. She will follow-up in 2 months for a pessary check or sooner as needed.  All questions were answered.  Samary Shatz B, NP 10:00 AM

## 2024-02-12 ENCOUNTER — Ambulatory Visit (INDEPENDENT_AMBULATORY_CARE_PROVIDER_SITE_OTHER)

## 2024-02-12 DIAGNOSIS — J3089 Other allergic rhinitis: Secondary | ICD-10-CM | POA: Diagnosis not present

## 2024-02-12 DIAGNOSIS — J309 Allergic rhinitis, unspecified: Secondary | ICD-10-CM

## 2024-02-18 ENCOUNTER — Ambulatory Visit

## 2024-02-18 DIAGNOSIS — J309 Allergic rhinitis, unspecified: Secondary | ICD-10-CM

## 2024-02-18 DIAGNOSIS — J3089 Other allergic rhinitis: Secondary | ICD-10-CM

## 2024-03-22 ENCOUNTER — Ambulatory Visit

## 2024-03-22 DIAGNOSIS — J309 Allergic rhinitis, unspecified: Secondary | ICD-10-CM

## 2024-03-22 DIAGNOSIS — J302 Other seasonal allergic rhinitis: Secondary | ICD-10-CM | POA: Diagnosis not present

## 2024-04-11 ENCOUNTER — Encounter: Payer: Self-pay | Admitting: Radiology

## 2024-04-11 ENCOUNTER — Ambulatory Visit: Admitting: Radiology

## 2024-04-11 VITALS — BP 120/64 | HR 58

## 2024-04-11 DIAGNOSIS — Z4689 Encounter for fitting and adjustment of other specified devices: Secondary | ICD-10-CM | POA: Diagnosis not present

## 2024-04-11 NOTE — Progress Notes (Signed)
" ° °  Subjective:     History of Present Illness: Kathryn Davis is a 73 y.o. female with stage III pelvic organ prolapse who presents for a pessary check. She is using a size 4 ring with support pessary. The pessary has been working well and she has no complaints. She is using vaginal estrogen regularly.  Past Medical History: Patient  has a past medical history of Asthma, Cystocele, Osteoarthritis, Urinary incontinence, Uterine prolapse, and Vaginal pessary present.   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   Medications: She has a current medication list which includes the following prescription(s): albuterol , atorvastatin, qvar  redihaler, bisacodyl, citalopram , comirnaty, elderberry, epinephrine , estradiol , fexofenadine , fluticasone , ipratropium, multiple vitamin, NON FORMULARY, spacer/aero-holding chambers, symbicort , triamcinolone  cream, and vitamin d (cholecalciferol).   Allergies: Patient is allergic to molds & smuts and shellfish allergy.       Objective:    Physical Exam: BP 120/64 (BP Location: Left Arm, Patient Position: Sitting)   Pulse (!) 58   SpO2 98%  Gen: No apparent distress, A&O x 3. Detailed Urogynecologic Evaluation:  Pelvic Exam: Normal external female genitalia; Bartholin's and Skene's glands normal in appearance; urethral meatus normal in appearance, no urethral masses or discharge. The pessary was noted to be in place. It was removed and cleaned. Speculum exam revealed no erythema in the vagina. The pessary was replaced. It was comfortable to the patient and fit well.     Assessment/Plan:    1. Pessary maintenance (Primary)   She will keep the pessary in place until next visit. She will continue to use estrogen.  She will follow-up in 2 months for a pessary check or sooner as needed.  All questions were  answered.  Leland Staszewski B, NP 9:32 AM   "

## 2024-04-18 DIAGNOSIS — J302 Other seasonal allergic rhinitis: Secondary | ICD-10-CM

## 2024-05-12 ENCOUNTER — Other Ambulatory Visit: Payer: Self-pay | Admitting: Radiology

## 2024-05-12 DIAGNOSIS — N760 Acute vaginitis: Secondary | ICD-10-CM

## 2024-05-12 NOTE — Telephone Encounter (Signed)
 Med refill request:   estradiol  (ESTRACE ) 0.1 MG/GM vaginal cream  Start:  01/21/24 Disp:  42.5 g Refills:  1  Last AEX:  04/11/24 Next OV:  06/10/24 (Pessary check) Next AEX:  Not yet scheduled Last MMG (if hormonal med): 12/24/23 Refill authorized? Please Advise.

## 2024-06-08 ENCOUNTER — Ambulatory Visit: Admitting: Allergy

## 2024-06-10 ENCOUNTER — Ambulatory Visit: Admitting: Radiology

## 2024-12-27 ENCOUNTER — Ambulatory Visit: Admitting: Physician Assistant
